# Patient Record
Sex: Male | Born: 1955
Health system: Southern US, Community
[De-identification: ages and names within clinical notes are randomized; demographics above are authoritative.]

## PROBLEM LIST (undated history)

## (undated) DIAGNOSIS — Q231 Congenital insufficiency of aortic valve: Secondary | ICD-10-CM

## (undated) DIAGNOSIS — I471 Supraventricular tachycardia: Secondary | ICD-10-CM

## (undated) DIAGNOSIS — I10 Essential (primary) hypertension: Secondary | ICD-10-CM

## (undated) DIAGNOSIS — G4733 Obstructive sleep apnea (adult) (pediatric): Secondary | ICD-10-CM

## (undated) DIAGNOSIS — I351 Nonrheumatic aortic (valve) insufficiency: Secondary | ICD-10-CM

## (undated) DIAGNOSIS — R002 Palpitations: Secondary | ICD-10-CM

## (undated) DIAGNOSIS — R42 Dizziness and giddiness: Secondary | ICD-10-CM

## (undated) DIAGNOSIS — I493 Ventricular premature depolarization: Secondary | ICD-10-CM

## (undated) DIAGNOSIS — R0602 Shortness of breath: Secondary | ICD-10-CM

## (undated) DIAGNOSIS — R079 Chest pain, unspecified: Secondary | ICD-10-CM

## (undated) DIAGNOSIS — E7800 Pure hypercholesterolemia, unspecified: Secondary | ICD-10-CM

## (undated) DIAGNOSIS — K5090 Crohn's disease, unspecified, without complications: Secondary | ICD-10-CM

## (undated) DIAGNOSIS — I712 Thoracic aortic aneurysm, without rupture: Secondary | ICD-10-CM

## (undated) HISTORY — DX: Nonrheumatic aortic (valve) insufficiency: I35.1

## (undated) HISTORY — DX: Palpitations: R00.2

## (undated) HISTORY — DX: Thoracic aortic aneurysm, without rupture: I71.2

## (undated) HISTORY — DX: Shortness of breath: R06.02

## (undated) HISTORY — DX: Crohn's disease, unspecified, without complications: K50.90

## (undated) HISTORY — DX: Ventricular premature depolarization: I49.3

## (undated) HISTORY — PX: SHOULDER SURGERY: SHX246

## (undated) HISTORY — DX: Dizziness and giddiness: R42

## (undated) HISTORY — DX: Chest pain, unspecified: R07.9

## (undated) HISTORY — DX: Supraventricular tachycardia: I47.1

## (undated) HISTORY — DX: Congenital insufficiency of aortic valve: Q23.1

## (undated) HISTORY — PX: KIDNEY STONE SURGERY: SHX686

## (undated) HISTORY — DX: Obstructive sleep apnea (adult) (pediatric): G47.33

## (undated) HISTORY — PX: PTERYGIUM EXCISION: SHX2273

---
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
## 1998-06-19 ENCOUNTER — Ambulatory Visit (HOSPITAL_COMMUNITY): Admission: RE | Admit: 1998-06-19 | Discharge: 1998-06-19 | Payer: Self-pay | Admitting: Neurology

## 2004-10-27 ENCOUNTER — Ambulatory Visit: Payer: Self-pay | Admitting: Pulmonary Disease

## 2004-11-18 ENCOUNTER — Ambulatory Visit: Payer: Self-pay | Admitting: Pulmonary Disease

## 2004-12-14 ENCOUNTER — Ambulatory Visit: Payer: Self-pay | Admitting: Internal Medicine

## 2005-01-13 ENCOUNTER — Ambulatory Visit: Payer: Self-pay | Admitting: Pulmonary Disease

## 2005-02-09 ENCOUNTER — Ambulatory Visit: Payer: Self-pay | Admitting: Pulmonary Disease

## 2005-02-11 ENCOUNTER — Ambulatory Visit: Payer: Self-pay | Admitting: Pulmonary Disease

## 2005-03-10 ENCOUNTER — Ambulatory Visit: Payer: Self-pay | Admitting: Pulmonary Disease

## 2005-04-21 ENCOUNTER — Ambulatory Visit: Payer: Self-pay | Admitting: Pulmonary Disease

## 2005-06-02 ENCOUNTER — Ambulatory Visit: Payer: Self-pay | Admitting: Pulmonary Disease

## 2005-07-14 ENCOUNTER — Ambulatory Visit: Payer: Self-pay | Admitting: Pulmonary Disease

## 2007-05-23 ENCOUNTER — Encounter: Admission: RE | Admit: 2007-05-23 | Discharge: 2007-05-23 | Payer: Self-pay | Admitting: Gastroenterology

## 2011-09-05 ENCOUNTER — Emergency Department (HOSPITAL_COMMUNITY): Payer: 59

## 2011-09-05 ENCOUNTER — Emergency Department (HOSPITAL_COMMUNITY)
Admission: EM | Admit: 2011-09-05 | Discharge: 2011-09-05 | Disposition: A | Discharge status: Home / Self Care | Payer: 59 | Attending: Emergency Medicine | Admitting: Emergency Medicine

## 2011-09-05 DIAGNOSIS — R350 Frequency of micturition: Secondary | ICD-10-CM | POA: Insufficient documentation

## 2011-09-05 DIAGNOSIS — R109 Unspecified abdominal pain: Secondary | ICD-10-CM | POA: Insufficient documentation

## 2011-09-05 DIAGNOSIS — K5900 Constipation, unspecified: Secondary | ICD-10-CM | POA: Insufficient documentation

## 2011-09-05 DIAGNOSIS — K5090 Crohn's disease, unspecified, without complications: Secondary | ICD-10-CM | POA: Insufficient documentation

## 2011-09-05 DIAGNOSIS — K6289 Other specified diseases of anus and rectum: Secondary | ICD-10-CM | POA: Insufficient documentation

## 2011-09-05 LAB — CBC
HCT: 41.1 % (ref 39.0–52.0)
Hemoglobin: 14.9 g/dL (ref 13.0–17.0)
RBC: 4.75 MIL/uL (ref 4.22–5.81)
RDW: 13 % (ref 11.5–15.5)
WBC: 13.4 10*3/uL — ABNORMAL HIGH (ref 4.0–10.5)

## 2011-09-05 LAB — URINALYSIS, ROUTINE W REFLEX MICROSCOPIC
Bilirubin Urine: NEGATIVE
Leukocytes, UA: NEGATIVE
Nitrite: NEGATIVE
Specific Gravity, Urine: 1.021 (ref 1.005–1.030)
pH: 6 (ref 5.0–8.0)

## 2011-09-05 LAB — BASIC METABOLIC PANEL
BUN: 17 mg/dL (ref 6–23)
CO2: 26 mEq/L (ref 19–32)
Chloride: 102 mEq/L (ref 96–112)
GFR calc Af Amer: >90 mL/min (ref >90)
Potassium: 3.9 mEq/L (ref 3.5–5.1)

## 2011-09-05 LAB — DIFFERENTIAL
Basophils Absolute: 0 10*3/uL (ref 0.0–0.1)
Eosinophils Relative: 0 % (ref 0–5)
Lymphocytes Relative: 8 % — ABNORMAL LOW (ref 12–46)
Neutro Abs: 11.4 10*3/uL — ABNORMAL HIGH (ref 1.7–7.7)
Neutrophils Relative %: 85 % — ABNORMAL HIGH (ref 43–77)

## 2011-09-05 LAB — OCCULT BLOOD, POC DEVICE: Fecal Occult Bld: NEGATIVE

## 2011-09-05 MED ORDER — IOHEXOL 300 MG/ML  SOLN
80.0000 mL | Freq: Once | INTRAMUSCULAR | Status: AC | PRN
  Administered 2011-09-05: 80 mL via INTRAVENOUS

## 2016-09-08 ENCOUNTER — Telehealth: Payer: Self-pay | Admitting: Cardiovascular Disease

## 2016-09-08 NOTE — Telephone Encounter (Signed)
Received records from [facility] for appointment on 09/21/16 with Dr Oval Linsey.  Records given to [facility] (medical records) for Dr Blenda Mounts schedule on 09/21/16. lp

## 2016-09-14 ENCOUNTER — Encounter (INDEPENDENT_AMBULATORY_CARE_PROVIDER_SITE_OTHER): Payer: BLUE CROSS/BLUE SHIELD

## 2016-09-14 ENCOUNTER — Other Ambulatory Visit: Payer: Self-pay | Admitting: Nurse Practitioner

## 2016-09-14 DIAGNOSIS — R079 Chest pain, unspecified: Secondary | ICD-10-CM

## 2016-09-14 DIAGNOSIS — R42 Dizziness and giddiness: Secondary | ICD-10-CM

## 2016-09-20 NOTE — Progress Notes (Signed)
Cardiology Office Note   Date:  09/21/2016   ID:  Ryan Burton, DOB [birth year], MRN [id number]  PCP:  Irven Shelling, MD  Cardiologist:   Skeet Latch, MD   Chief Complaint  Patient presents with  . New Patient (Initial Visit)  . Chest Pain    dull ache, tightness  . Dizziness    randomly.  . Dysmenorrhea    in feet and legs      History of Present Illness: Ryan Burton is a 60 y.o. male with Crohn's disease who presents for an evaluation of chest pain and dizziness. Ryan Burton saw [name], NP on 09/07/16 and reported episods of dizzness and chest pain.  For the last six months he reports episodes of feeling disoriented and dizzy.  It typically occurs when he is is a seated position such as driving or when in a board room.  It is worse when he turns his head drastically.  He hasn't noted it when shaving.  The episodes last for approximately 5 seconds.  He also sometimes notes chest tightness that occurs after the dizzy episodes and sometimes is unrelated.  There is no associated shortness of breath, nausea or diaphoresis.  The episodes don't occur with exertion.  Ryan Burton exercises for 45 minutes most days of the week.  He walks on the treadmill or uses the elliptical and light weights.    He has no chest pain or shortness of breath with this activity, though he notes that he does not exercise very vigorously. He reports having a healthy diet and limits his red meat intake.  Ryan Burton notes that he has been stressed lately.  He has been working up to 70 hours per week.  He notes palpitations that last for less than one minute that are not necessarily associated with shortness of breath or stressful situations. He worries that he  Might have atrial fibrillation, as his mother has this diagnosis. She developed atrial fibrillation in her 50s.  He drinks 2-3 cups of coffee daily and does not use over-the-counter cold or cough medications.   Past  Medical History:  Diagnosis Date  . Chest pain   . Crohn's disease ([place])   . Dizziness   . Palpitations 09/21/2016    Past Surgical History:  Procedure Laterality Date  . KIDNEY STONE SURGERY    . PTERYGIUM EXCISION    . SHOULDER SURGERY       Current Outpatient Prescriptions  Medication Sig Dispense Refill  . aspirin 81 MG tablet Take 81 mg by mouth.    . cyanocobalamin 1000 MCG tablet Take 1,000 mcg by mouth daily.    . Multiple Vitamins-Minerals (MULTIVITAMIN WITH MINERALS) tablet Take 1 tablet by mouth daily.     No current facility-administered medications for this visit.     Allergies:   Ivp dye [iodinated diagnostic agents]    Social History:  The patient  reports that he has never smoked. He does not have any smokeless tobacco history on file. He reports that he drinks alcohol.   Family History:  The patient's family history includes Atrial fibrillation in his mother; Dementia in his father; Stroke in his paternal grandfather.    ROS:  Please see the history of present illness.   Otherwise, review of systems are positive for none.   All other systems are reviewed and negative.    PHYSICAL EXAM: VS:  BP 140/89   Pulse 63   Ht 5\' 10"  (1.778 m)  Wt 78.4 kg (172 lb 12.8 oz)   BMI 24.79 kg/m  , BMI Body mass index is 24.79 kg/m. GENERAL:  Well appearing HEENT:  Pupils equal round and reactive, fundi not visualized, oral mucosa unremarkable NECK:  No jugular venous distention, waveform within normal limits, carotid upstroke brisk and symmetric, no bruits, no thyromegaly LYMPHATICS:  No cervical adenopathy LUNGS:  Clear to auscultation bilaterally HEART:  RRR.  PMI not displaced or sustained,S1 and S2 within normal limits, no S3, no S4, no clicks, no rubs, II/VI sysolic murmur at the LUSB ABD:  Flat, positive bowel sounds normal in frequency in pitch, no bruits, no rebound, no guarding, no midline pulsatile mass, no hepatomegaly, no splenomegaly EXT:  2 plus  pulses throughout, no edema, no cyanosis no clubbing SKIN:  No rashes no nodules NEURO:  Cranial nerves II through XII grossly intact, motor grossly intact throughout PSYCH:  Cognitively intact, oriented to person place and time    EKG:  EKG is not ordered today. The ekg ordered today demonstrates sinus rhythm rate 70 bpm. One PVC.   Recent Labs: No results found for requested labs within last 8760 hours.   09/07/16: Sodium 139, potassium 4.4, BUN 16, creatinine 0.86 WBC 5.9, hemoglobin 15.9, hematocrit 45.4, platelets 209 Troponin less than 0.01  Lipid Panel No results found for: CHOL, TRIG, HDL, CHOLHDL, VLDL, LDLCALC, LDLDIRECT    Wt Readings from Last 3 Encounters:  09/21/16 78.4 kg (172 lb 12.8 oz)      ASSESSMENT AND PLAN:  # Palpitations:  Ryan Burton has short episodes of palpitations. He did have one PVC on his EKG and the suspect that this is what he is feeling. Given the fact that last for less than a minute a likely is not atrial fibrillation. He has worn a monitor and we're currently waiting for this to be uploaded. All his laboratory testing has been unremarkable thus far. We will add a TSH and free T4 is is has not yet been tested.  # Dizziness: Symptoms may be related to carotid hypersensitivity, as it was worse when he turns his head.  He does not have a carotid bruit  It did not become presyncopal with carotid massage. He has no exertional symptoms and it does not occur with positional changes which makes it less likely that this is cardiac in nature. Consider BPPV as well.  # Murmur: Ryan Burton has a faint systolic murmur on exam.  We will get an echo to assess. He has no evidence of heart failure on exam.  # Chest pain: Ms. Mcclaughry has atypical chest pain.  We will get an ETT to assess.   Current medicines are reviewed at length with the patient today.  The patient does not have concerns regarding medicines.  The following changes have been made:   no change  Labs/ tests ordered today include:   Orders Placed This Encounter  Procedures  . T4, free  . TSH  . Exercise Tolerance Test  . ECHOCARDIOGRAM COMPLETE     Disposition:   FU with Alegra Rost C. Oval Linsey, MD, [facility] in 1 month     This note was written with the assistance of speech recognition software.  Please excuse any transcriptional errors.  Signed, Gayl Ivanoff C. Oval Linsey, MD, [facility]  09/21/2016 1:37 PM    [facility]

## 2016-09-21 ENCOUNTER — Ambulatory Visit (INDEPENDENT_AMBULATORY_CARE_PROVIDER_SITE_OTHER): Payer: BLUE CROSS/BLUE SHIELD | Admitting: Cardiovascular Disease

## 2016-09-21 ENCOUNTER — Encounter: Payer: Self-pay | Admitting: Cardiovascular Disease

## 2016-09-21 VITALS — BP 140/89 | HR 63 | Ht 70.0 in | Wt 172.8 lb

## 2016-09-21 DIAGNOSIS — R079 Chest pain, unspecified: Secondary | ICD-10-CM

## 2016-09-21 DIAGNOSIS — R42 Dizziness and giddiness: Secondary | ICD-10-CM | POA: Insufficient documentation

## 2016-09-21 DIAGNOSIS — R011 Cardiac murmur, unspecified: Secondary | ICD-10-CM | POA: Diagnosis not present

## 2016-09-21 DIAGNOSIS — R002 Palpitations: Secondary | ICD-10-CM | POA: Diagnosis not present

## 2016-09-21 HISTORY — DX: Palpitations: R00.2

## 2016-09-21 LAB — TSH: TSH: 1.79 m[IU]/L (ref 0.40–4.50)

## 2016-09-21 LAB — T4, FREE: Free T4: 1.2 ng/dL (ref 0.8–1.8)

## 2016-09-21 NOTE — Patient Instructions (Addendum)
Medication Instructions:  Your physician recommends that you continue on your current medications as directed. Please refer to the Current Medication list given to you today.  Labwork: FT4/TSH AT [facility] ON THE FIRST FLOOR  Testing/Procedures: Your physician has requested that you have an echocardiogram. Echocardiography is a painless test that uses sound waves to create images of your heart. It provides your doctor with information about the size and shape of your heart and how well your heart's chambers and valves are working. This procedure takes approximately one hour. There are no restrictions for this procedure. [place] has requested that you have an exercise tolerance test. For further information please visit [email or web address]. Please also follow instruction sheet, as given.  Follow-Up: Your physician recommends that you schedule a follow-up appointment in: [place]  If you need a refill on your cardiac medications before your next appointment, please call your pharmacy.  Echocardiogram An echocardiogram, or echocardiography, uses sound waves (ultrasound) to produce an image of your heart. The echocardiogram is simple, painless, obtained within a short period of time, and offers valuable information to your health care provider. The images from an echocardiogram can provide information such as:  Evidence of coronary artery disease (CAD).  Heart size.  Heart muscle function.  Heart valve function.  Aneurysm detection.  Evidence of a past heart attack.  Fluid buildup around the heart.  Heart muscle thickening.  Assess heart valve function. Tell a health care provider about:  Any allergies you have.  All medicines you are taking, including vitamins, herbs, eye drops, creams, and over-the-counter medicines.  Any problems you or family members have had with anesthetic medicines.  Any blood disorders you  have.  Any surgeries you have had.  Any medical conditions you have.  Whether you are pregnant or may be pregnant. What happens before the procedure? No special preparation is needed. Eat and drink normally. What happens during the procedure?  In order to produce an image of your heart, gel will be applied to your chest and a wand-like tool (transducer) will be moved over your chest. The gel will help transmit the sound waves from the transducer. The sound waves will harmlessly bounce off your heart to allow the heart images to be captured in real-time motion. These images will then be recorded.  You may need an IV to receive a medicine that improves the quality of the pictures. What happens after the procedure? You may return to your normal schedule including diet, activities, and medicines, unless your health care provider tells you otherwise. This information is not intended to replace advice given to you by your health care provider. Make sure you discuss any questions you have with your health care provider. Document Released: 10/22/2000 Document Revised: 06/12/2016 Document Reviewed: 07/02/2013 Elsevier Interactive Patient Education  2017 [place].   Exercise Stress Electrocardiogram An exercise stress electrocardiogram is a test that is done to evaluate the blood supply to your heart. This test may also be called exercise stress electrocardiography. The test is done while you are walking on a treadmill. The goal of this test is to raise your heart rate. This test is done to find areas of poor blood flow to the heart by determining the extent of coronary artery disease (CAD). CAD is defined as narrowing in one or more heart (coronary) arteries of more than 70%. If you have an abnormal test result, this may mean that you are  not getting adequate blood flow to your heart during exercise. Additional testing may be needed to understand why your test was abnormal. Tell a health care  provider about:  Any allergies you have.  All medicines you are taking, including vitamins, herbs, eye drops, creams, and over-the-counter medicines.  Any problems you or family members have had with anesthetic medicines.  Any blood disorders you have.  Any surgeries you have had.  Any medical conditions you have.  Possibility of pregnancy, if this applies. What are the risks? Generally, this is a safe procedure. However, as with any procedure, complications can occur. Possible complications can include:  Pain or pressure in the following areas:  Chest.  Jaw or neck.  Between your shoulder blades.  Radiating down your left arm.  Dizziness or light-headedness.  Shortness of breath.  Increased or irregular heartbeats.  Nausea or vomiting.  Heart attack (rare). What happens before the procedure?  Avoid all forms of caffeine 24 hours before your test or as directed by your health care provider. This includes coffee, tea (even decaffeinated tea), caffeinated sodas, chocolate, cocoa, and certain pain medicines.  Follow your health care provider's instructions regarding eating and drinking before the test.  Take your medicines as directed at regular times with water unless instructed otherwise. Exceptions may include:  If you have diabetes, ask how you are to take your insulin or pills. It is common to adjust insulin dosing the morning of the test.  If you are taking beta-blocker medicines, it is important to talk to your health care provider about these medicines well before the date of your test. Taking beta-blocker medicines may interfere with the test. In some cases, these medicines need to be changed or stopped 24 hours or more before the test.  If you wear a nitroglycerin patch, it may need to be removed prior to the test. Ask your health care provider if the patch should be removed before the test.  If you use an inhaler for any breathing condition, bring it with  you to the test.  If you are an outpatient, bring a snack so you can eat right after the stress phase of the test.  Do not smoke for 4 hours prior to the test or as directed by your health care provider.  Do not apply lotions, powders, creams, or oils on your chest prior to the test.  Wear loose-fitting clothes and comfortable shoes for the test. This test involves walking on a treadmill. What happens during the procedure?  Multiple patches (electrodes) will be put on your chest. If needed, small areas of your chest may have to be shaved to get better contact with the electrodes. Once the electrodes are attached to your body, multiple wires will be attached to the electrodes and your heart rate will be monitored.  Your heart will be monitored both at rest and while exercising.  You will walk on a treadmill. The treadmill will be started at a slow pace. The treadmill speed and incline will gradually be increased to raise your heart rate. What happens after the procedure?  Your heart rate and blood pressure will be monitored after the test.  You may return to your normal schedule including diet, activities, and medicines, unless your health care provider tells you otherwise. This information is not intended to replace advice given to you by your health care provider. Make sure you discuss any questions you have with your health care provider. Document Released: 10/22/2000 Document Revised: 04/01/2016 Document  Reviewed: 07/02/2013 Elsevier Interactive Patient Education  2017 [employer].

## 2016-10-15 ENCOUNTER — Encounter: Payer: Self-pay | Admitting: Cardiovascular Disease

## 2016-10-21 ENCOUNTER — Other Ambulatory Visit: Payer: Self-pay

## 2016-10-21 ENCOUNTER — Ambulatory Visit (INDEPENDENT_AMBULATORY_CARE_PROVIDER_SITE_OTHER): Payer: BLUE CROSS/BLUE SHIELD

## 2016-10-21 ENCOUNTER — Ambulatory Visit (HOSPITAL_COMMUNITY): Payer: BLUE CROSS/BLUE SHIELD | Attending: Cardiology

## 2016-10-21 DIAGNOSIS — R011 Cardiac murmur, unspecified: Secondary | ICD-10-CM

## 2016-10-21 DIAGNOSIS — R079 Chest pain, unspecified: Secondary | ICD-10-CM | POA: Diagnosis present

## 2016-10-21 LAB — EXERCISE TOLERANCE TEST
CHL CUP MPHR: 159 {beats}/min
CHL CUP RESTING HR STRESS: 70 {beats}/min
CHL RATE OF PERCEIVED EXERTION: 17
CSEPEDS: 0 s
CSEPPHR: 155 {beats}/min
Estimated workload: 11.7 METS
Exercise duration (min): 10 min
Percent HR: 97 %

## 2016-10-25 ENCOUNTER — Ambulatory Visit (INDEPENDENT_AMBULATORY_CARE_PROVIDER_SITE_OTHER): Payer: BLUE CROSS/BLUE SHIELD | Admitting: Cardiovascular Disease

## 2016-10-25 ENCOUNTER — Encounter: Payer: Self-pay | Admitting: Cardiovascular Disease

## 2016-10-25 VITALS — BP 122/80 | HR 74 | Ht 70.0 in | Wt 174.2 lb

## 2016-10-25 DIAGNOSIS — R002 Palpitations: Secondary | ICD-10-CM

## 2016-10-25 DIAGNOSIS — Q231 Congenital insufficiency of aortic valve: Secondary | ICD-10-CM

## 2016-10-25 DIAGNOSIS — Q2381 Bicuspid aortic valve: Secondary | ICD-10-CM

## 2016-10-25 DIAGNOSIS — I351 Nonrheumatic aortic (valve) insufficiency: Secondary | ICD-10-CM | POA: Diagnosis not present

## 2016-10-25 DIAGNOSIS — I7121 Aneurysm of the ascending aorta, without rupture: Secondary | ICD-10-CM

## 2016-10-25 DIAGNOSIS — I712 Thoracic aortic aneurysm, without rupture: Secondary | ICD-10-CM

## 2016-10-25 HISTORY — DX: Thoracic aortic aneurysm, without rupture: I71.2

## 2016-10-25 HISTORY — DX: Aneurysm of the ascending aorta, without rupture: I71.21

## 2016-10-25 HISTORY — DX: Bicuspid aortic valve: Q23.81

## 2016-10-25 HISTORY — DX: Congenital insufficiency of aortic valve: Q23.1

## 2016-10-25 HISTORY — DX: Nonrheumatic aortic (valve) insufficiency: I35.1

## 2016-10-25 NOTE — Progress Notes (Signed)
Cardiology Office Note   Date:  10/25/2016   ID:  Ryan Burton, DOB [birth year], MRN [id number]  PCP:  Irven Shelling, MD  Cardiologist:   Skeet Latch, MD   Chief Complaint  Patient presents with  . Follow-up      History of Present Illness: Ryan Burton is a 60 y.o. male with Crohn's disease who presents for follow-up. Ryan Burton saw [name], NP on 09/07/16 and reported episods of dizzness and chest pain.   He was evaluated in clinic 09/21/16. He was noted to have a murmur on exam. He was referred for an echocardiogram 10/21/16 that revealed LVEF 60-65% with grade 1 diastolic dysfunction. He was also noted to have a functionally bicuspid aortic valve with moderate aortic regurgitation. He also had mild to moderate dilation of the aortic root measuring 4.4 cm. He were 48 hour Holter that showed occasional PVCs but was otherwise unremarkable.  Since his last appointment Ryan Burton has been feeling well.  He denies any recent Chest pain or shortness of breath. He also has not noted any lower extremity edema, orthopnea, PND. He continues to exercise regularly 5 days per week. He has no exertional symptoms.  Past Medical History:  Diagnosis Date  . Aneurysm of ascending aorta ([place]) 10/25/2016   4.4cm by echo 10/21/16  . Bicuspid aortic valve 10/25/2016  . Chest pain   . Crohn's disease ([place])   . Dizziness   . Moderate aortic regurgitation 10/25/2016  . Palpitations 09/21/2016    Past Surgical History:  Procedure Laterality Date  . KIDNEY STONE SURGERY    . PTERYGIUM EXCISION    . SHOULDER SURGERY       Current Outpatient Prescriptions  Medication Sig Dispense Refill  . aspirin 81 MG tablet Take 81 mg by mouth.    . cyanocobalamin 1000 MCG tablet Take 1,000 mcg by mouth daily.    . Multiple Vitamins-Minerals (MULTIVITAMIN WITH MINERALS) tablet Take 1 tablet by mouth daily.     No current facility-administered medications for this visit.       Allergies:   Ivp dye [iodinated diagnostic agents] and Shellfish allergy    Social History:  The patient  reports that he has never smoked. He has never used smokeless tobacco. He reports that he drinks alcohol. He reports that he does not use drugs.   Family History:  The patient's family history includes Atrial fibrillation in his mother; Dementia in his father; Stroke in his paternal grandfather.    ROS:  Please see the history of present illness.   Otherwise, review of systems are positive for none.   All other systems are reviewed and negative.    PHYSICAL EXAM: VS:  BP 122/80 (BP Location: Right Arm, Patient Position: Sitting, Cuff Size: Normal)   Pulse 74   Ht 5\' 10"  (1.778 m)   Wt 79 kg (174 lb 3.2 oz)   SpO2 98%   BMI 25.00 kg/m  , BMI Body mass index is 25 kg/m. GENERAL:  Well appearing HEENT:  Pupils equal round and reactive, fundi not visualized, oral mucosa unremarkable NECK:  No jugular venous distention, waveform within normal limits, carotid upstroke brisk and symmetric, no bruits, no thyromegaly LYMPHATICS:  No cervical adenopathy LUNGS:  Clear to auscultation bilaterally HEART:  RRR.  PMI not displaced or sustained,S1 and S2 within normal limits, no S3, no S4, no clicks, no rubs, II/VI sysolic murmur at the LUSB ABD:  Flat, positive bowel sounds normal in frequency in  pitch, no bruits, no rebound, no guarding, no midline pulsatile mass, no hepatomegaly, no splenomegaly EXT:  2 plus pulses throughout, no edema, no cyanosis no clubbing SKIN:  No rashes no nodules NEURO:  Cranial nerves II through XII grossly intact, motor grossly intact throughout PSYCH:  Cognitively intact, oriented to person place and time   EKG:  EKG is not ordered today. The ekg ordered today demonstrates sinus rhythm rate 70 bpm. One PVC.   Recent Labs: 09/21/2016: TSH 1.79   09/07/16: Sodium 139, potassium 4.4, BUN 16, creatinine 0.86 WBC 5.9, hemoglobin 15.9, hematocrit 45.4,  platelets 209 Troponin less than 0.01  48 hour Holter 09/14/16:  Quality: Fair.  Baseline artifact. Predominant rhythm: sinus rhythm, sinus bradycardia Average heart rate: 70 bpm Max heart rate: 127 bpm Min heart rate: 40 bpm One pause 1.9 sec <1% PVCs Short runs of atrial tachycardia  ETT 10/21/16:  Blood pressure demonstrated a normal response to exercise.  There was no ST segment deviation noted during stress.  Clinically and electrically negative for ischemia  Exclellent exercise capacity  Echo 10/21/16: Study Conclusions  - Left ventricle: The cavity size was normal. There was mild focal   basal hypertrophy of the septum. Systolic function was normal.   The estimated ejection fraction was in the range of 60% to 65%.   Wall motion was normal; there were no regional wall motion   abnormalities. Doppler parameters are consistent with abnormal   left ventricular relaxation (grade 1 diastolic dysfunction). - Aortic valve: Trileaflet; but functionally bicuspid aortic valve   with co-joined left and right leaflets. There was moderate   regurgitation. - Aortic root: The aortic root was dilated measuring 44 mm. - Mitral valve: Structurally normal valve. There was mild   regurgitation. - Left atrium: The atrium was at the upper limits of normal in   size. - Right ventricle: Systolic function was normal. - Right atrium: The atrium was normal in size. - Tricuspid valve: There was trivial regurgitation. - Pulmonary arteries: Systolic pressure was within the normal   range. - Main pulmonary artery: Diameter: 25 mm (ED). - Inferior vena cava: The vessel was normal in size. - Pericardium, extracardiac: There was no pericardial effusion.  Impressions:  - The aortic root was dilated measuring 44 mm with no aortic   stenosis and moderate aortic insufficiency.   Aortic root and ascending aorta are mildly dilated.  Lipid Panel No results found for: CHOL, TRIG, HDL,  CHOLHDL, VLDL, LDLCALC, LDLDIRECT    Wt Readings from Last 3 Encounters:  10/25/16 79 kg (174 lb 3.2 oz)  09/21/16 78.4 kg (172 lb 12.8 oz)      ASSESSMENT AND PLAN:  # Aortic regurgitation: # Bicuspid aortic valve: # Ascending aortic aneurysm: Ryan Burton has a bicuspid aortic valve And moderate aortic regurgitation. We will repeat his echo in 1 month. We discussed warning signs for heart failure including lower extremity edema and shortness of breath. He expressed understanding.  # PVCs:  Laboratory testing has been normal.  48 hour Holter showed one pause <2 seconds, several PVCs (<1%) and short runs of atrial tachycardia.  Symptoms have resolved.   # Dizziness: Resolved.   # Chest pain: Resolved.  ETT negative for ischemia.   Time spent: 25 minutes-Greater than 50% of this time was spent in counseling, explanation of diagnosis, planning of further management, and coordination of care.   Current medicines are reviewed at length with the patient today.  The patient does not have  concerns regarding medicines.  The following changes have been made:  no change  Labs/ tests ordered today include:   Orders Placed This Encounter  Procedures  . ECHOCARDIOGRAM COMPLETE     Disposition:   FU with Kellan Boehlke C. Oval Linsey, MD, [facility] in 1 month     This note was written with the assistance of speech recognition software.  Please excuse any transcriptional errors.  Signed, Marshall Kampf C. Oval Linsey, MD, [facility]  10/25/2016 8:38 AM    [facility]

## 2016-10-25 NOTE — Patient Instructions (Signed)
Medication Instructions:  Your physician recommends that you continue on your current medications as directed. Please refer to the Current Medication list given to you today.  Labwork: none  Testing/Procedures: Your physician has requested that you have an echocardiogram. Echocardiography is a painless test that uses sound waves to create images of your heart. It provides your doctor with information about the size and shape of your heart and how well your heart's chambers and valves are working. This procedure takes approximately one hour. There are no restrictions for this procedure IN 1 YEAR, OFFICE VISIT FEW DAYS AFTER ECHO   Follow-Up: Your physician wants you to follow-up in: 1 year ov after Echo  You will receive a reminder letter in the mail two months in advance. If you don't receive a letter, please call our office to schedule the follow-up appointment.  If you need a refill on your cardiac medications before your next appointment, please call your pharmacy.

## 2016-10-27 ENCOUNTER — Ambulatory Visit: Payer: BLUE CROSS/BLUE SHIELD | Admitting: Cardiovascular Disease

## 2016-11-24 ENCOUNTER — Ambulatory Visit (HOSPITAL_COMMUNITY)
Admission: EM | Admit: 2016-11-24 | Discharge: 2016-11-24 | Disposition: A | Discharge status: Home / Self Care | Payer: BLUE CROSS/BLUE SHIELD | Attending: Family Medicine | Admitting: Family Medicine

## 2016-11-24 ENCOUNTER — Encounter (HOSPITAL_COMMUNITY): Payer: Self-pay | Admitting: *Deleted

## 2016-11-24 DIAGNOSIS — J069 Acute upper respiratory infection, unspecified: Secondary | ICD-10-CM

## 2016-11-24 DIAGNOSIS — B9789 Other viral agents as the cause of diseases classified elsewhere: Secondary | ICD-10-CM | POA: Diagnosis not present

## 2016-11-24 MED ORDER — GUAIFENESIN-CODEINE 100-10 MG/5ML PO SYRP: 10.0000 mL | ORAL_SOLUTION | Freq: Four times a day (QID) | ORAL | 0 refills | Status: DC | PRN

## 2016-11-24 MED ORDER — IPRATROPIUM BROMIDE 0.06 % NA SOLN: 2.0000 | Freq: Four times a day (QID) | NASAL | 1 refills | Status: DC

## 2016-11-24 NOTE — ED Triage Notes (Signed)
Pt  Reports     Cough       And      Scratchy  t  Throat          X 1.5  Days         Recent  Air  Travels         Appears  In no  severe  Distress

## 2016-11-24 NOTE — ED Provider Notes (Signed)
[place]    CSN: [id number] Arrival date & time: 11/24/16  1038     History   Chief Complaint Chief Complaint  Patient presents with  . Cough    HPI Ryan Burton is a 61 y.o. male.   The history is provided by the patient.  Cough  Cough characteristics:  Non-productive Severity:  Moderate Onset quality:  Gradual Duration:  2 days Chronicity:  New Smoker: no   Context: sick contacts, upper respiratory infection and weather changes   Context comment:  Back from travel and sx developed Relieved by:  None tried Worsened by:  Nothing Ineffective treatments:  None tried Associated symptoms: rhinorrhea and sinus congestion   Associated symptoms: no fever and no shortness of breath     Past Medical History:  Diagnosis Date  . Aneurysm of ascending aorta ([place]) 10/25/2016   4.4cm by echo 10/21/16  . Bicuspid aortic valve 10/25/2016  . Chest pain   . Crohn's disease ([place])   . Dizziness   . Moderate aortic regurgitation 10/25/2016  . Palpitations 09/21/2016    Patient Active Problem List   Diagnosis Date Noted  . Moderate aortic regurgitation 10/25/2016  . Bicuspid aortic valve 10/25/2016  . Aneurysm of ascending aorta ([facility]) 10/25/2016  . Dizziness 09/21/2016  . Palpitations 09/21/2016    Past Surgical History:  Procedure Laterality Date  . KIDNEY STONE SURGERY    . PTERYGIUM EXCISION    . SHOULDER SURGERY         Home Medications    Prior to Admission medications   Medication Sig Start Date End Date Taking? Authorizing Provider  aspirin 81 MG tablet Take 81 mg by mouth.    Historical Provider, MD  cyanocobalamin 1000 MCG tablet Take 1,000 mcg by mouth daily.    Historical Provider, MD  Multiple Vitamins-Minerals (MULTIVITAMIN WITH MINERALS) tablet Take 1 tablet by mouth daily.    Historical Provider, MD    Family History Family History  Problem Relation Age of Onset  . Atrial fibrillation Mother   . Dementia Father   . Stroke  Paternal Grandfather     Social History Social History  Substance Use Topics  . Smoking status: Never Smoker  . Smokeless tobacco: Never Used  . Alcohol use Yes     Comment: 12 DRINKS PER WEEK, COMBINED BEER, WINE, LIQUOR      Allergies   Ivp dye [iodinated diagnostic agents] and Shellfish allergy   Review of Systems Review of Systems  Constitutional: Negative.  Negative for fever.  HENT: Positive for congestion, postnasal drip and rhinorrhea.   Respiratory: Positive for cough. Negative for shortness of breath.   Cardiovascular: Negative.   Gastrointestinal: Negative.   Genitourinary: Negative.   Musculoskeletal: Negative.   All other systems reviewed and are negative.    Physical Exam Triage Vital Signs ED Triage Vitals [11/24/16 1118]  Enc Vitals Group     BP 139/93     Pulse Rate 82     Resp 18     Temp 98.9 F (37.2 C)     Temp Source Oral     SpO2 97 %     Weight      Height      Head Circumference      Peak Flow      Pain Score      Pain Loc      Pain Edu?      Excl. in [place]?    No data found.  Updated Vital Signs BP 139/93 (BP Location: Right Arm)   Pulse 82   Temp 98.9 F (37.2 C) (Oral)   Resp 18   SpO2 97%   Visual Acuity Right Eye Distance:   Left Eye Distance:   Bilateral Distance:    Right Eye Near:   Left Eye Near:    Bilateral Near:     Physical Exam  Constitutional: He appears well-developed and well-nourished. No distress.  HENT:  Right Ear: External ear normal.  Left Ear: External ear normal.  Nose: Nose normal.  Mouth/Throat: Oropharynx is clear and moist.  Eyes: Pupils are equal, round, and reactive to light.  Neck: Normal range of motion.  Cardiovascular: Normal rate, regular rhythm, normal heart sounds and intact distal pulses.   Pulmonary/Chest: Effort normal and breath sounds normal. He has no wheezes. He has no rales.  Lymphadenopathy:    He has no cervical adenopathy.  Neurological: He is alert.  Nursing note  and vitals reviewed.    UC Treatments / Results  Labs (all labs ordered are listed, but only abnormal results are displayed) Labs Reviewed - No data to display  EKG  EKG Interpretation None       Radiology No results found.  Procedures Procedures (including critical care time)  Medications Ordered in UC Medications - No data to display   Initial Impression / Assessment and Plan / UC Course  I have reviewed the triage vital signs and the nursing notes.  Pertinent labs & imaging results that were available during my care of the patient were reviewed by me and considered in my medical decision making (see chart for details).  Clinical Course       Final Clinical Impressions(s) / UC Diagnoses   Final diagnoses:  None    New Prescriptions New Prescriptions   No medications on file     Billy Fischer, MD 12/08/16 2046

## 2017-01-13 ENCOUNTER — Encounter: Payer: Self-pay | Admitting: Cardiovascular Disease

## 2017-01-13 ENCOUNTER — Ambulatory Visit (INDEPENDENT_AMBULATORY_CARE_PROVIDER_SITE_OTHER): Payer: BLUE CROSS/BLUE SHIELD | Admitting: Cardiovascular Disease

## 2017-01-13 VITALS — BP 135/80 | HR 60 | Ht 70.0 in | Wt 173.0 lb

## 2017-01-13 DIAGNOSIS — I712 Thoracic aortic aneurysm, without rupture: Secondary | ICD-10-CM | POA: Diagnosis not present

## 2017-01-13 DIAGNOSIS — R0602 Shortness of breath: Secondary | ICD-10-CM

## 2017-01-13 DIAGNOSIS — I7121 Aneurysm of the ascending aorta, without rupture: Secondary | ICD-10-CM

## 2017-01-13 DIAGNOSIS — I351 Nonrheumatic aortic (valve) insufficiency: Secondary | ICD-10-CM

## 2017-01-13 DIAGNOSIS — I471 Supraventricular tachycardia: Secondary | ICD-10-CM

## 2017-01-13 DIAGNOSIS — I493 Ventricular premature depolarization: Secondary | ICD-10-CM | POA: Diagnosis not present

## 2017-01-13 DIAGNOSIS — I4719 Other supraventricular tachycardia: Secondary | ICD-10-CM

## 2017-01-13 MED ORDER — METOPROLOL TARTRATE 25 MG PO TABS: 12.5000 mg | ORAL_TABLET | ORAL | 3 refills | Status: DC | PRN

## 2017-01-13 NOTE — Progress Notes (Signed)
Cardiology Office Note   Date:  01/15/2017   ID:  Ryan Burton, DOB [birth year], MRN [id number]  PCP:  Irven Shelling, MD  Cardiologist:   Skeet Latch, MD   Chief Complaint  Patient presents with  . Follow-up  . Chest Pain    chest tightness;  . Palpitations    at night.       History of Present Illness: Ryan Burton is a 61 y.o. male with Crohn's disease who presents for follow-up.  Ryan Burton saw [name], NP on 09/07/16 and reported episods of dizzness and chest pain.   He was evaluated in clinic 09/21/16. He was noted to have a murmur on exam. He was referred for an echocardiogram 10/21/16 that revealed LVEF 60-65% with grade 1 diastolic dysfunction. He was also noted to have a functionally bicuspid aortic valve with moderate aortic regurgitation. He also had mild to moderate dilation of the aortic root measuring 4.4 cm. He were 48 hour Holter that showed occasional PVCs but was otherwise unremarkable.  Since his last appointment Ryan Burton has noted some episodes of chest tightness.  For the last month he also endorses shortness of breath when walking and has noticed that he get short of breath if he tries to walk and talk at the same time.  He has been much busier at work, working 70 hour weeks, and wonders if this is associated.  The chest tightness occurs both at rest and with exertion.  For several days last week it was constant throughout the day.  He hasn't been exercising as regularly given that he has been busy at work.  His symptoms started after he had URI symptoms last month.  However he continues to have chest dicsomfort and shortness of breath after his cold symptoms dissipated.  He has also noted heart pounding when he lays in bed at night.  This happens approximately three times per week and lasts for up to 20 minutes at a time. There is no associated lightheadedness, dizziness or shortness of breath.  He tried limiting caffeine intake  without any changes.  He denies lower extremity edema, orthopnea or PND.   Past Medical History:  Diagnosis Date  . Aneurysm of ascending aorta ([place]) 10/25/2016   4.4cm by echo 10/21/16  . Atrial tachycardia ([place]) 01/15/2017  . Bicuspid aortic valve 10/25/2016  . Chest pain   . Crohn's disease ([place])   . Dizziness   . Moderate aortic regurgitation 10/25/2016  . Palpitations 09/21/2016  . PVC (premature ventricular contraction) 01/15/2017  . Shortness of breath 01/15/2017    Past Surgical History:  Procedure Laterality Date  . KIDNEY STONE SURGERY    . PTERYGIUM EXCISION    . SHOULDER SURGERY       Current Outpatient Prescriptions  Medication Sig Dispense Refill  . aspirin 81 MG tablet Take 81 mg by mouth.    . cyanocobalamin 1000 MCG tablet Take 1,000 mcg by mouth daily.    . Multiple Vitamins-Minerals (MULTIVITAMIN WITH MINERALS) tablet Take 1 tablet by mouth daily.    . metoprolol tartrate (LOPRESSOR) 25 MG tablet Take 0.5 tablets (12.5 mg total) by mouth as needed. Every 6 hours as needed for tachycardia 30 tablet 3   No current facility-administered medications for this visit.     Allergies:   Ivp dye [iodinated diagnostic agents] and Shellfish allergy    Social History:  The patient  reports that he has never smoked. He has never used smokeless tobacco. He  reports that he drinks alcohol. He reports that he does not use drugs.   Family History:  The patient's family history includes Atrial fibrillation in his mother; Dementia in his father; Stroke in his paternal grandfather.    ROS:  Please see the history of present illness.   Otherwise, review of systems are positive for none.   All other systems are reviewed and negative.    PHYSICAL EXAM: VS:  BP 135/80   Pulse 60   Ht 5\' 10"  (1.778 m)   Wt 78.5 kg (173 lb)   BMI 24.82 kg/m  , BMI Body mass index is 24.82 kg/m. GENERAL:  Well appearing HEENT:  Pupils equal round and reactive, fundi not visualized, oral  mucosa unremarkable NECK:  No jugular venous distention, waveform within normal limits, carotid upstroke brisk and symmetric, no bruits, no thyromegaly LYMPHATICS:  No cervical adenopathy LUNGS:  Clear to auscultation bilaterally HEART:  RRR.  PMI not displaced or sustained,S1 and S2 within normal limits, no S3, no S4, no clicks, no rubs, II/VI diastolic murmur at the LUSB ABD:  Flat, positive bowel sounds normal in frequency in pitch, no bruits, no rebound, no guarding, no midline pulsatile mass, no hepatomegaly, no splenomegaly EXT:  2 plus pulses throughout, no edema, no cyanosis no clubbing SKIN:  No rashes no nodules NEURO:  Cranial nerves II through XII grossly intact, motor grossly intact throughout PSYCH:  Cognitively intact, oriented to person place and time   EKG:  EKG is ordered today. 01/13/17: sinus rhythm.  Rate 60 bpm   Recent Labs: 09/21/2016: TSH 1.79   09/07/16: Sodium 139, potassium 4.4, BUN 16, creatinine 0.86 WBC 5.9, hemoglobin 15.9, hematocrit 45.4, platelets 209 Troponin less than 0.01  48 hour Holter 09/14/16:  Quality: Fair.  Baseline artifact. Predominant rhythm: sinus rhythm, sinus bradycardia Average heart rate: 70 bpm Max heart rate: 127 bpm Min heart rate: 40 bpm One pause 1.9 sec <1% PVCs Short runs of atrial tachycardia  ETT 10/21/16:  Blood pressure demonstrated a normal response to exercise.  There was no ST segment deviation noted during stress.  Clinically and electrically negative for ischemia  Exclellent exercise capacity  Echo 10/21/16: Study Conclusions  - Left ventricle: The cavity size was normal. There was mild focal   basal hypertrophy of the septum. Systolic function was normal.   The estimated ejection fraction was in the range of 60% to 65%.   Wall motion was normal; there were no regional wall motion   abnormalities. Doppler parameters are consistent with abnormal   left ventricular relaxation (grade 1 diastolic  dysfunction). - Aortic valve: Trileaflet; but functionally bicuspid aortic valve   with co-joined left and right leaflets. There was moderate   regurgitation. - Aortic root: The aortic root was dilated measuring 44 mm. - Mitral valve: Structurally normal valve. There was mild   regurgitation. - Left atrium: The atrium was at the upper limits of normal in   size. - Right ventricle: Systolic function was normal. - Right atrium: The atrium was normal in size. - Tricuspid valve: There was trivial regurgitation. - Pulmonary arteries: Systolic pressure was within the normal   range. - Main pulmonary artery: Diameter: 25 mm (ED). - Inferior vena cava: The vessel was normal in size. - Pericardium, extracardiac: There was no pericardial effusion.  Impressions:  - The aortic root was dilated measuring 44 mm with no aortic   stenosis and moderate aortic insufficiency.   Aortic root and ascending aorta are mildly  dilated.  Lipid Panel No results found for: CHOL, TRIG, HDL, CHOLHDL, VLDL, LDLCALC, LDLDIRECT    Wt Readings from Last 3 Encounters:  01/13/17 78.5 kg (173 lb)  10/25/16 79 kg (174 lb 3.2 oz)  09/21/16 78.4 kg (172 lb 12.8 oz)      ASSESSMENT AND PLAN:  # Aortic regurgitation: # Bicuspid aortic valve: # Ascending aortic aneurysm: # Shortness of breath: Ryan Burton has a bicuspid aortic valve and moderate aortic regurgitation.  He has new symptoms of shortness of breath.  He does not have evidence of heart failure on exam.  We will repeat his echo now to ensure that there is no change in his aortic aneurysm or aortic valve disease.  Endocarditis prophylaxis is not indicated.   # PVCs:  Laboratory testing has been normal.  48 hour Holter showed one pause <2 seconds, several PVCs (<1%) and short runs of atrial tachycardia. This is the likely cause of his palpitations.  We will start metoprolol tartrate 12.5mg  q6h prn given that his heart rate is slow at baseline.  #  Dizziness: Resolved.   # Chest pain: Seems unlikely to be ischemia.  ETT negative for ischemia 10/2016.     Current medicines are reviewed at length with the patient today.  The patient does not have concerns regarding medicines.  The following changes have been made:  no change  Labs/ tests ordered today include:   Orders Placed This Encounter  Procedures  . EKG 12-Lead  . ECHOCARDIOGRAM COMPLETE     Disposition:   FU with Fred Hammes C. Oval Linsey, MD, [facility] in 1 month     This note was written with the assistance of speech recognition software.  Please excuse any transcriptional errors.  Signed, Ara Grandmaison C. Oval Linsey, MD, [facility]  01/15/2017 3:04 PM    [facility]

## 2017-01-13 NOTE — Patient Instructions (Signed)
Medication Instructions:  START metoprolol tartrate 12.5 mg (1/2 tablet) AS NEEDED every 6 hours for tachycardia or fast heart rate  Labwork: NONE  Testing/Procedures: Your physician has requested that you have an echocardiogram. Echocardiography is a painless test that uses sound waves to create images of your heart. It provides your doctor with information about the size and shape of your heart and how well your heart's chambers and valves are working. This procedure takes approximately one hour. There are no restrictions for this procedure.   Follow-Up: Your physician recommends that you schedule a follow-up appointment in: 1 month with Dr. Oval Linsey.   Any Other Special Instructions Will Be Listed Below (If Applicable).     If you need a refill on your cardiac medications before your next appointment, please call your pharmacy.

## 2017-01-15 ENCOUNTER — Encounter: Payer: Self-pay | Admitting: Cardiovascular Disease

## 2017-01-15 DIAGNOSIS — I471 Supraventricular tachycardia: Secondary | ICD-10-CM

## 2017-01-15 DIAGNOSIS — R0602 Shortness of breath: Secondary | ICD-10-CM

## 2017-01-15 DIAGNOSIS — I493 Ventricular premature depolarization: Secondary | ICD-10-CM

## 2017-01-15 DIAGNOSIS — I4719 Other supraventricular tachycardia: Secondary | ICD-10-CM

## 2017-01-15 HISTORY — DX: Supraventricular tachycardia: I47.1

## 2017-01-15 HISTORY — DX: Shortness of breath: R06.02

## 2017-01-15 HISTORY — DX: Ventricular premature depolarization: I49.3

## 2017-01-15 HISTORY — DX: Other supraventricular tachycardia: I47.19

## 2017-01-31 ENCOUNTER — Other Ambulatory Visit: Payer: Self-pay

## 2017-01-31 ENCOUNTER — Ambulatory Visit (HOSPITAL_COMMUNITY): Payer: BLUE CROSS/BLUE SHIELD | Attending: Cardiovascular Disease

## 2017-01-31 DIAGNOSIS — R0602 Shortness of breath: Secondary | ICD-10-CM | POA: Diagnosis not present

## 2017-01-31 DIAGNOSIS — I351 Nonrheumatic aortic (valve) insufficiency: Secondary | ICD-10-CM | POA: Insufficient documentation

## 2017-02-16 NOTE — Progress Notes (Addendum)
Cardiology Office Note   Date:  02/17/2017   ID:  Ryan Burton, DOB 1956/05/23, MRN [id number]  PCP:  Irven Shelling, MD  Cardiologist:   Skeet Latch, MD   No chief complaint on file.     History of Present Illness: Ryan Burton is a 61 y.o. male with a bicuspid aortic valve, mild ascending aorta aneurysm, moderate aortic regurgitation, and Crohn's disease who presents for follow-up.  Ryan Burton saw Ryan Garibaldi, NP on 09/07/16 and reported episods of dizzness and chest pain.   He was evaluated in clinic 09/21/16. He was noted to have a murmur on exam. He was referred for an echocardiogram 10/21/16 that revealed LVEF 60-65% with grade 1 diastolic dysfunction. He was also noted to have a functionally bicuspid aortic valve with moderate aortic regurgitation. He also had mild to moderate dilation of the aortic root measuring 4.4 cm.  He had a repeat echo 01/2017  That was essentially unchanged.   He wore a 48 hour Holter that showed occasional PVCs but was otherwise unremarkable.  He also had an ETT 10/2016 that was negative for ischemia.  He hasn't been experiencing PVCs lately so he hasn't been taking metoprolol.  He continues to exercise 5 days per week. He has no chest pain or shortness of breath with activity. He also denies lower extremity edema, orthopnea, or PND.   Past Medical History:  Diagnosis Date  . Aneurysm of ascending aorta ([place]) 10/25/2016   4.4cm by echo 10/21/16  . Atrial tachycardia ([place]) 01/15/2017  . Bicuspid aortic valve 10/25/2016  . Chest pain   . Crohn's disease ([place])   . Dizziness   . Moderate aortic regurgitation 10/25/2016  . Palpitations 09/21/2016  . PVC (premature ventricular contraction) 01/15/2017  . Shortness of breath 01/15/2017    Past Surgical History:  Procedure Laterality Date  . KIDNEY STONE SURGERY    . PTERYGIUM EXCISION    . SHOULDER SURGERY       Current Outpatient Prescriptions  Medication Sig Dispense  Refill  . aspirin 81 MG tablet Take 81 mg by mouth.    . cyanocobalamin 1000 MCG tablet Take 1,000 mcg by mouth daily.    . Multiple Vitamins-Minerals (MULTIVITAMIN WITH MINERALS) tablet Take 1 tablet by mouth daily.     No current facility-administered medications for this visit.     Allergies:   Ivp dye [iodinated diagnostic agents] and Shellfish allergy    Social History:  The patient  reports that he has never smoked. He has never used smokeless tobacco. He reports that he drinks alcohol. He reports that he does not use drugs.   Family History:  The patient's family history includes Atrial fibrillation in his mother; Dementia in his father; Stroke in his paternal grandfather.    ROS:  Please see the history of present illness.   Otherwise, review of systems are positive for none.   All other systems are reviewed and negative.    PHYSICAL EXAM: VS:  BP 116/70   Pulse 68   Ht 5\' 10"  (1.778 m)   Wt 78.3 kg (172 lb 9.6 oz)   BMI 24.77 kg/m  , BMI Body mass index is 24.77 kg/m. GENERAL:  Well appearing HEENT:  Pupils equal round and reactive, fundi not visualized, oral mucosa unremarkable NECK:  No jugular venous distention, waveform within normal limits, carotid upstroke brisk and symmetric, no bruits LYMPHATICS:  No cervical adenopathy LUNGS:  Clear to auscultation bilaterally HEART:  RRR.  PMI  not displaced or sustained,S1 and S2 within normal limits, no S3, no S4, no clicks, no rubs, I/VI diastolic murmur at the left upper sternal border ABD:  Flat, positive bowel sounds normal in frequency in pitch, no bruits, no rebound, no guarding, no midline pulsatile mass, no hepatomegaly, no splenomegaly EXT:  2 plus pulses throughout, no edema, no cyanosis no clubbing SKIN:  No rashes no nodules NEURO:  Cranial nerves II through XII grossly intact, motor grossly intact throughout PSYCH:  Cognitively intact, oriented to person place and time   EKG:  EKG is not ordered today. 01/13/17:  sinus rhythm.  Rate 60 bpm   Recent Labs: 09/21/2016: TSH 1.79   09/07/16: Sodium 139, potassium 4.4, BUN 16, creatinine 0.86 WBC 5.9, hemoglobin 15.9, hematocrit 45.4, platelets 209 Troponin less than 0.01  03/19/2015: Cholesterol 185, triglycerides 96, HDL 42, LDL 123  48 hour Holter 09/14/16:  Quality: Fair.  Baseline artifact. Predominant rhythm: sinus rhythm, sinus bradycardia Average heart rate: 70 bpm Max heart rate: 127 bpm Min heart rate: 40 bpm One pause 1.9 sec <1% PVCs Short runs of atrial tachycardia  ETT 10/21/16:  Blood pressure demonstrated a normal response to exercise.  There was no ST segment deviation noted during stress.  Clinically and electrically negative for ischemia  Exclellent exercise capacity  Echo 10/21/16: Study Conclusions  - Left ventricle: The cavity size was normal. There was mild focal   basal hypertrophy of the septum. Systolic function was normal.   The estimated ejection fraction was in the range of 60% to 65%.   Wall motion was normal; there were no regional wall motion   abnormalities. Doppler parameters are consistent with abnormal   left ventricular relaxation (grade 1 diastolic dysfunction). - Aortic valve: Trileaflet; but functionally bicuspid aortic valve   with co-joined left and right leaflets. There was moderate   regurgitation. - Aortic root: The aortic root was dilated measuring 44 mm. - Mitral valve: Structurally normal valve. There was mild   regurgitation. - Left atrium: The atrium was at the upper limits of normal in   size. - Right ventricle: Systolic function was normal. - Right atrium: The atrium was normal in size. - Tricuspid valve: There was trivial regurgitation. - Pulmonary arteries: Systolic pressure was within the normal   range. - Main pulmonary artery: Diameter: 25 mm (ED). - Inferior vena cava: The vessel was normal in size. - Pericardium, extracardiac: There was no pericardial  effusion.  Impressions:  - The aortic root was dilated measuring 44 mm with no aortic   stenosis and moderate aortic insufficiency.   Aortic root and ascending aorta are mildly dilated.  Echo 01/31/17: Study Conclusions  - Left ventricle: The cavity size was normal. Wall thickness was   normal. Systolic function was normal. The estimated ejection   fraction was in the range of 55% to 60%. Wall motion was normal;   there were no regional wall motion abnormalities. Doppler   parameters are consistent with abnormal left ventricular   relaxation (grade 1 diastolic dysfunction). - Aortic valve: Possible bicuspid valve with apparent fusion of the   right and left coronary cusps. There was no stenosis. There was   mild to moderate regurgitation. - Aorta: Mildly dilated aortic root. Aortic root dimension: 43 mm   (ED). Ascending aortic diameter: 39 mm (S). - Mitral valve: There was trivial regurgitation. - Right ventricle: The cavity size was normal. Systolic function   was normal. - Tricuspid valve: Peak RV-RA gradient (S):  24 mm Hg. - Pulmonary arteries: PA peak pressure: 27 mm Hg (S). - Inferior vena cava: The vessel was normal in size. The   respirophasic diameter changes were in the normal range (>= 50%),   consistent with normal central venous pressure.   Lipid Panel No results found for: CHOL, TRIG, HDL, CHOLHDL, VLDL, LDLCALC, LDLDIRECT    Wt Readings from Last 3 Encounters:  02/17/17 78.3 kg (172 lb 9.6 oz)  01/13/17 78.5 kg (173 lb)  10/25/16 79 kg (174 lb 3.2 oz)      ASSESSMENT AND PLAN:  # Aortic regurgitation: # Bicuspid aortic valve: # Ascending aortic aneurysm: Mr. Jolley has a bicuspid aortic valve and mild-moderate aortic regurgitation.  His echocardiogram was stable. We will repeat in 1 year. Shortness of breath has resolved.   # PVCs:  Laboratory testing has been normal.  48 hour Holter showed one pause <2 seconds, several PVCs (<1%) and short runs  of atrial tachycardia. He hasn't manic using many palpitations lately but has metoprolol take as needed.  # Chest pain:  ETT negative for ischemia 10/2016.     Current medicines are reviewed at length with the patient today.  The patient does not have concerns regarding medicines.  The following changes have been made:  no change  Labs/ tests ordered today include:   Orders Placed This Encounter  Procedures  . ECHOCARDIOGRAM COMPLETE     Disposition:   FU with Reathel Turi C. Oval Linsey, MD, [facility] in 1 year.   This note was written with the assistance of speech recognition software.  Please excuse any transcriptional errors.  Signed, Azell Bill C. Oval Linsey, MD, [facility]  02/17/2017 8:37 AM    [facility]

## 2017-02-17 ENCOUNTER — Ambulatory Visit (INDEPENDENT_AMBULATORY_CARE_PROVIDER_SITE_OTHER): Payer: BLUE CROSS/BLUE SHIELD | Admitting: Cardiovascular Disease

## 2017-02-17 ENCOUNTER — Encounter: Payer: Self-pay | Admitting: Cardiovascular Disease

## 2017-02-17 VITALS — BP 116/70 | HR 68 | Ht 70.0 in | Wt 172.6 lb

## 2017-02-17 DIAGNOSIS — I712 Thoracic aortic aneurysm, without rupture: Secondary | ICD-10-CM | POA: Diagnosis not present

## 2017-02-17 DIAGNOSIS — I351 Nonrheumatic aortic (valve) insufficiency: Secondary | ICD-10-CM

## 2017-02-17 DIAGNOSIS — I493 Ventricular premature depolarization: Secondary | ICD-10-CM

## 2017-02-17 DIAGNOSIS — Q231 Congenital insufficiency of aortic valve: Secondary | ICD-10-CM

## 2017-02-17 DIAGNOSIS — I7121 Aneurysm of the ascending aorta, without rupture: Secondary | ICD-10-CM

## 2017-02-17 NOTE — Patient Instructions (Addendum)
Medication Instructions:  Your physician recommends that you continue on your current medications as directed. Please refer to the Current Medication list given to you today.  Labwork: Will request lipid panel from your Primary Care   Testing/Procedures: Your physician has requested that you have an echocardiogram. Echocardiography is a painless test that uses sound waves to create images of your heart. It provides your doctor with information about the size and shape of your heart and how well your heart's chambers and valves are working. This procedure takes approximately one hour. There are no restrictions for this procedure. 1 year   Follow-Up: Your physician wants you to follow-up in: 1 year after Echo You will receive a reminder letter in the mail two months in advance. If you don't receive a letter, please call our office to schedule the follow-up appointment.  If you need a refill on your cardiac medications before your next appointment, please call your pharmacy.

## 2018-03-08 ENCOUNTER — Ambulatory Visit (HOSPITAL_COMMUNITY): Payer: Managed Care, Other (non HMO) | Attending: Cardiology

## 2018-03-08 ENCOUNTER — Other Ambulatory Visit: Payer: Self-pay

## 2018-03-08 DIAGNOSIS — I712 Thoracic aortic aneurysm, without rupture: Secondary | ICD-10-CM | POA: Insufficient documentation

## 2018-03-08 DIAGNOSIS — I7121 Aneurysm of the ascending aorta, without rupture: Secondary | ICD-10-CM

## 2018-03-08 DIAGNOSIS — I493 Ventricular premature depolarization: Secondary | ICD-10-CM | POA: Insufficient documentation

## 2018-03-08 DIAGNOSIS — I351 Nonrheumatic aortic (valve) insufficiency: Secondary | ICD-10-CM | POA: Insufficient documentation

## 2018-03-08 DIAGNOSIS — I471 Supraventricular tachycardia: Secondary | ICD-10-CM | POA: Insufficient documentation

## 2018-03-21 ENCOUNTER — Ambulatory Visit: Payer: BLUE CROSS/BLUE SHIELD | Admitting: Cardiovascular Disease

## 2018-03-21 ENCOUNTER — Encounter: Payer: Self-pay | Admitting: Cardiovascular Disease

## 2018-03-21 VITALS — BP 122/72 | HR 57 | Ht 70.0 in | Wt 172.4 lb

## 2018-03-21 DIAGNOSIS — I712 Thoracic aortic aneurysm, without rupture: Secondary | ICD-10-CM

## 2018-03-21 DIAGNOSIS — I351 Nonrheumatic aortic (valve) insufficiency: Secondary | ICD-10-CM

## 2018-03-21 DIAGNOSIS — I7121 Aneurysm of the ascending aorta, without rupture: Secondary | ICD-10-CM

## 2018-03-21 DIAGNOSIS — Q231 Congenital insufficiency of aortic valve: Secondary | ICD-10-CM | POA: Diagnosis not present

## 2018-03-21 NOTE — Patient Instructions (Signed)

## 2018-03-21 NOTE — Progress Notes (Signed)
Cardiology Office Note   Date:  03/21/2018   ID:  Ryan Burton, DOB 05-Oct-1956, MRN [id number]  PCP:  Lavone Orn, MD  Cardiologist:   Skeet Latch, MD   No chief complaint on file.     History of Present Illness: Ryan Burton is a 62 y.o. male with a bicuspid aortic valve, mild ascending aorta aneurysm, moderate aortic regurgitation, and Crohn's disease who presents for follow-up.  Mr.Bench saw Ryan Garibaldi, NP on 09/07/16 and reported episods of dizzness and chest pain.   He was evaluated in clinic 09/21/16. He was noted to have a murmur on exam. He was referred for an echocardiogram 10/21/16 that revealed LVEF 60-65% with grade 1 diastolic dysfunction. He was also noted to have a functionally bicuspid aortic valve with moderate aortic regurgitation. He also had mild to moderate dilation of the aortic root measuring 4.4 cm.  He had a repeat echo 01/2017 and 03/2018 that were essentially unchanged.   He wore a 48 hour Holter that showed occasional PVCs but was otherwise unremarkable.  He also had an ETT 10/2016 that was negative for ischemia.  Mr. [name] has been feeling well.  6 weeks ago he gave up caffeine.  Since then he has no more chest tightness or PVCs.  He continues to go to the fitness center nearly every morning.  He rides the treadmill,, elliptical and stationary bike.  He feels well with exercise.  He has no chest pain or shortness of breath.  He has not noted any lower extremity edema, orthopnea, or PND.  He is due for fasting labs with his PCP.   Past Medical History:  Diagnosis Date  . Aneurysm of ascending aorta ([place]) 10/25/2016   4.4cm by echo 10/21/16  . Atrial tachycardia ([place]) 01/15/2017  . Bicuspid aortic valve 10/25/2016  . Chest pain   . Crohn's disease ([place])   . Dizziness   . Moderate aortic regurgitation 10/25/2016  . Palpitations 09/21/2016  . PVC (premature ventricular contraction) 01/15/2017  . Shortness of breath 01/15/2017     Past Surgical History:  Procedure Laterality Date  . KIDNEY STONE SURGERY    . PTERYGIUM EXCISION    . SHOULDER SURGERY       Current Outpatient Medications  Medication Sig Dispense Refill  . cyanocobalamin 1000 MCG tablet Take 1,000 mcg by mouth daily.    . Multiple Vitamins-Minerals (MULTIVITAMIN WITH MINERALS) tablet Take 1 tablet by mouth daily.     No current facility-administered medications for this visit.     Allergies:   Ivp dye [iodinated diagnostic agents] and Shellfish allergy    Social History:  The patient  reports that he has never smoked. He has never used smokeless tobacco. He reports that he drinks alcohol. He reports that he does not use drugs.   Family History:  The patient's family history includes Atrial fibrillation in his mother; Dementia in his father; Stroke in his paternal grandfather.    ROS:  Please see the history of present illness.   Otherwise, review of systems are positive for none.   All other systems are reviewed and negative.    PHYSICAL EXAM: VS:  BP 122/72   Pulse (!) 57   Ht 5\' 10"  (1.778 m)   Wt 172 lb 6.4 oz (78.2 kg)   BMI 24.74 kg/m  , BMI Body mass index is 24.74 kg/m. GENERAL:  Well appearing.  No acute distress. HEENT:  Pupils equal round and reactive, fundi not visualized,  oral mucosa unremarkable NECK:  No jugular venous distention, waveform within normal limits, carotid upstroke brisk and symmetric, no bruits LYMPHATICS:  No cervical adenopathy LUNGS:  Clear to auscultation bilaterally HEART:  RRR.  PMI not displaced or sustained,S1 and S2 within normal limits, no S3, no S4, no clicks, no rubs, I/VI diastolic murmur at the left upper sternal border ABD:  Flat, positive bowel sounds normal in frequency in pitch, no bruits, no rebound, no guarding, no midline pulsatile mass, no hepatomegaly, no splenomegaly EXT:  2 plus pulses throughout, no edema, no cyanosis no clubbing SKIN:  No rashes no nodules NEURO:  Cranial  nerves II through XII grossly intact, motor grossly intact throughout PSYCH:  Cognitively intact, oriented to person place and time   EKG:  EKG is ordered today. 01/13/17: sinus rhythm.  Rate 60 bpm 03/21/18: Sinus bradycardia.  Rate 57 bpm.   Recent Labs: No results found for requested labs within last 8760 hours.   09/07/16: Sodium 139, potassium 4.4, BUN 16, creatinine 0.86 WBC 5.9, hemoglobin 15.9, hematocrit 45.4, platelets 209 Troponin less than 0.01  03/19/2015: Cholesterol 185, triglycerides 96, HDL 42, LDL 123  48 hour Holter 09/14/16:  Quality: Fair.  Baseline artifact. Predominant rhythm: sinus rhythm, sinus bradycardia Average heart rate: 70 bpm Max heart rate: 127 bpm Min heart rate: 40 bpm One pause 1.9 sec <1% PVCs Short runs of atrial tachycardia  ETT 10/21/16:  Blood pressure demonstrated a normal response to exercise.  There was no ST segment deviation noted during stress.  Clinically and electrically negative for ischemia  Exclellent exercise capacity  Echo 03/2018: Study Conclusions  - Left ventricle: The cavity size was normal. Wall thickness was   normal. Systolic function was normal. The estimated ejection   fraction was in the range of 55% to 60%. Wall motion was normal;   there were no regional wall motion abnormalities. Doppler   parameters are consistent with abnormal left ventricular   relaxation (grade 1 diastolic dysfunction). - Aortic valve: Bicuspid valve with fused left and right coronary   cusps. There was no stenosis. There was mild regurgitation. - Aorta: Mildly dilated aortic root and ascending aorta. Aortic   root dimension: 43 mm (ED). Ascending aortic diameter: 41 mm (S). - Right ventricle: The cavity size was normal. Systolic function   was normal. - Tricuspid valve: Peak RV-RA gradient (S): 25 mm Hg. - Pulmonary arteries: PA peak pressure: 28 mm Hg (S). - Inferior vena cava: The vessel was normal in size. The    respirophasic diameter changes were in the normal range (>= 50%),   consistent with normal central venous pressure.  Impressions:  - Normal LV size with EF 55-60%. Normal RV size and systolic   function. Bicuspid aortic valve (fused left and right coronary   cusps) with mild aortic insufficiency.  Echo 01/31/17: Study Conclusions  - Left ventricle: The cavity size was normal. Wall thickness was   normal. Systolic function was normal. The estimated ejection   fraction was in the range of 55% to 60%. Wall motion was normal;   there were no regional wall motion abnormalities. Doppler   parameters are consistent with abnormal left ventricular   relaxation (grade 1 diastolic dysfunction). - Aortic valve: Possible bicuspid valve with apparent fusion of the   right and left coronary cusps. There was no stenosis. There was   mild to moderate regurgitation. - Aorta: Mildly dilated aortic root. Aortic root dimension: 43 mm   (ED). Ascending aortic  diameter: 39 mm (S). - Mitral valve: There was trivial regurgitation. - Right ventricle: The cavity size was normal. Systolic function   was normal. - Tricuspid valve: Peak RV-RA gradient (S): 24 mm Hg. - Pulmonary arteries: PA peak pressure: 27 mm Hg (S). - Inferior vena cava: The vessel was normal in size. The   respirophasic diameter changes were in the normal range (>= 50%),   consistent with normal central venous pressure.   Lipid Panel No results found for: CHOL, TRIG, HDL, CHOLHDL, VLDL, LDLCALC, LDLDIRECT    Wt Readings from Last 3 Encounters:  03/21/18 172 lb 6.4 oz (78.2 kg)  02/17/17 172 lb 9.6 oz (78.3 kg)  01/13/17 173 lb (78.5 kg)      ASSESSMENT AND PLAN:  # Aortic regurgitation: # Bicuspid aortic valve: # Ascending aortic aneurysm: Mr. Goerner has a bicuspid aortic valve and mild-moderate aortic regurgitation.  His echocardiogram has been stable for the last 2 years.  He has no symptoms.  Repeat echo in 2  years.  # PVCs:  Laboratory testing has been normal.  48 hour Holter showed one pause <2 seconds, several PVCs (<1%) and short runs of atrial tachycardia. He is asymptomatic.   # Chest pain:  Resolved.  ETT negative for ischemia 10/2016.     Current medicines are reviewed at length with the patient today.  The patient does not have concerns regarding medicines.  The following changes have been made:  no change  Labs/ tests ordered today include:   No orders of the defined types were placed in this encounter.    Disposition:   FU with [place] Name C. Oval Linsey, MD, [facility] in 1 year.     Signed, [place] Pizzimenti C. Oval Linsey, MD, [facility]  03/21/2018 8:31 AM    [place]

## 2019-03-07 ENCOUNTER — Telehealth: Payer: Self-pay | Admitting: *Deleted

## 2019-03-07 NOTE — Telephone Encounter (Signed)
03/07/19 LMOM @ [id number], re: follow up appointment.

## 2019-08-08 ENCOUNTER — Encounter

## 2019-09-04 ENCOUNTER — Other Ambulatory Visit: Payer: Self-pay

## 2019-09-04 ENCOUNTER — Encounter: Payer: Self-pay | Admitting: Cardiovascular Disease

## 2019-09-04 ENCOUNTER — Ambulatory Visit: Payer: Managed Care, Other (non HMO) | Admitting: Cardiovascular Disease

## 2019-09-04 VITALS — BP 134/64 | HR 57 | Ht 70.0 in | Wt 175.4 lb

## 2019-09-04 DIAGNOSIS — I10 Essential (primary) hypertension: Secondary | ICD-10-CM | POA: Diagnosis not present

## 2019-09-04 DIAGNOSIS — E7800 Pure hypercholesterolemia, unspecified: Secondary | ICD-10-CM | POA: Diagnosis not present

## 2019-09-04 DIAGNOSIS — Q231 Congenital insufficiency of aortic valve: Secondary | ICD-10-CM

## 2019-09-04 HISTORY — DX: Essential (primary) hypertension: I10

## 2019-09-04 HISTORY — DX: Pure hypercholesterolemia, unspecified: E78.00

## 2019-09-04 NOTE — Patient Instructions (Addendum)
Medication Instructions:  Your physician recommends that you continue on your current medications as directed. Please refer to the Current Medication list given to you today.  *If you need a refill on your cardiac medications before your next appointment, please call your pharmacy*  Lab Work: FASTING LP/CMET TO BE DONE WHEN YOU GO FOR YOUR ECHO   Testing/Procedures: Your physician has requested that you have an echocardiogram. Echocardiography is a painless test that uses sound waves to create images of your heart. It provides your doctor with information about the size and shape of your heart and how well your heart's chambers and valves are working. This procedure takes approximately one hour. There are no restrictions for this procedure. [facility] [place], [address] TO BE DONE IN 3 MONTHS WEEK BEFORE FOLLOW UP WITH DR Bruning   Follow-Up: At [facility] you and your health needs are our priority.  As part of our continuing mission to provide you with exceptional heart care, we have created designated Provider Care Teams.  These Care Teams include your primary Cardiologist (physician) and Advanced Practice Providers (APPs -  Physician Assistants and Nurse Practitioners) who all work together to provide you with the care you need, when you need it.  Your next appointment:   3 months  The format for your next appointment:   Either In Person or Virtual  Provider:   You may see DR Aurelia Osborn [facility]  or one of the following Advanced Practice Providers on your designated Care Team:    Kerin Ransom, PA-C  [place], [place]  Coletta Memos, [place]   Other Instructions WORK ON DIET AND EXERCISE   MONITOR AND LOG YOUR BLOOD PRESSURE. BRING READINGS AND MACHINE TO FOLLOW UP

## 2019-09-04 NOTE — Progress Notes (Signed)
Cardiology Office Note   Date:  09/04/2019   ID:  Ryan Burton, DOB 1956-05-06, MRN [id number]  PCP:  Lavone Orn, MD  Cardiologist:   Skeet Latch, MD   No chief complaint on file.    History of Present Illness: Ryan Burton is a 63 y.o. male with a bicuspid aortic valve, mild ascending aorta aneurysm, moderate aortic regurgitation, and Crohn's disease who presents for follow-up.  Ryan Burton saw Ryan Garibaldi, NP on 09/07/16 and reported episods of dizzness and chest pain.   He was evaluated in clinic 09/21/16. He was noted to have a murmur on exam. He was referred for an echocardiogram 10/21/16 that revealed LVEF 60-65% with grade 1 diastolic dysfunction. He was also noted to have a functionally bicuspid aortic valve with moderate aortic regurgitation. He also had mild to moderate dilation of the aortic root measuring 4.4 cm.  He had a repeat echo 01/2017 and 03/2018 that were essentially unchanged.   He wore a 48 hour Holter that showed occasional PVCs but was otherwise unremarkable.  He also had an ETT 10/2016 that was negative for ischemia.  Lately he has been feeling well.  Occasionally he has a few seconds of dizziness.  It seems to occur randomly and is sometimes associated with a visual disturbance.  It seems like bright lights or diplopia.  He saw his PCP this summer and his BP was borderline.  He bought a blood pressure cuff and has been checking his blood pressure.  At home it has been running in the 120s to 150s over 70s to 80s.  His heart rate has been in the 50s to 80s.  Overall his diet is okay.  He has not been paying any particular attention to it and does like to have ice cream.  He goes to the gym regularly and walks and rides the elliptical for warm up but does not focus on cardio.  He plays golf and walks the course walking on his back.  He has no exertional chest pain or shortness of breath.  He also denies lower extremity edema, orthopnea, or PND.    Past Medical History:  Diagnosis Date  . Aneurysm of ascending aorta ([place]) 10/25/2016   4.4cm by echo 10/21/16  . Atrial tachycardia ([place]) 01/15/2017  . Bicuspid aortic valve 10/25/2016  . Chest pain   . Crohn's disease ([place])   . Dizziness   . Essential hypertension 09/04/2019  . Moderate aortic regurgitation 10/25/2016  . Palpitations 09/21/2016  . Pure hypercholesterolemia 09/04/2019  . PVC (premature ventricular contraction) 01/15/2017  . Shortness of breath 01/15/2017    Past Surgical History:  Procedure Laterality Date  . KIDNEY STONE SURGERY    . PTERYGIUM EXCISION    . SHOULDER SURGERY       Current Outpatient Medications  Medication Sig Dispense Refill  . cyanocobalamin 1000 MCG tablet Take 1,000 mcg by mouth daily.    . Multiple Vitamins-Minerals (MULTIVITAMIN WITH MINERALS) tablet Take 1 tablet by mouth daily.     No current facility-administered medications for this visit.     Allergies:   Ivp dye [iodinated diagnostic agents] and Shellfish allergy    Social History:  The patient  reports that he has never smoked. He has never used smokeless tobacco. He reports current alcohol use. He reports that he does not use drugs.   Family History:  The patient's family history includes Atrial fibrillation in his mother; Dementia in his father; Stroke in his paternal grandfather.  ROS:  Please see the history of present illness.   Otherwise, review of systems are positive for none.   All other systems are reviewed and negative.    PHYSICAL EXAM: VS:  BP 134/64   Pulse (!) 57   Ht 5\' 10"  (1.778 m)   Wt 175 lb 6.4 oz (79.6 kg)   BMI 25.17 kg/m  , BMI Body mass index is 25.17 kg/m. GENERAL:  Well appearing HEENT: Pupils equal round and reactive, fundi not visualized, oral mucosa unremarkable NECK:  No jugular venous distention, waveform within normal limits, carotid upstroke brisk and symmetric, no bruits, no thyromegaly LYMPHATICS:  No cervical adenopathy LUNGS:   Clear to auscultation bilaterally HEART:  RRR.  PMI not displaced or sustained,S1 and S2 within normal limits, no S3, no S4, no clicks, no rubs, no murmurs ABD:  Flat, positive bowel sounds normal in frequency in pitch, no bruits, no rebound, no guarding, no midline pulsatile mass, no hepatomegaly, no splenomegaly EXT:  2 plus pulses throughout, no edema, no cyanosis no clubbing SKIN:  No rashes no nodules NEURO:  Cranial nerves II through XII grossly intact, motor grossly intact throughout PSYCH:  Cognitively intact, oriented to person place and time   EKG:  EKG is ordered today. 01/13/17: sinus rhythm.  Rate 60 bpm 03/21/18: Sinus bradycardia.  Rate 57 bpm. 09/04/19: Sinus bradycardia.  Rate 57 bpm.   Recent Labs: No results found for requested labs within last 8760 hours.   06/14/2019: Total cholesterol 221, triglycerides 112, HDL 44, LDL 155 Hemoglobin A1c 5.4% Potassium 4.4, creatinine 0.8  03/19/2015: Cholesterol 185, triglycerides 96, HDL 42, LDL 123  48 hour Holter 09/14/16:  Quality: Fair.  Baseline artifact. Predominant rhythm: sinus rhythm, sinus bradycardia Average heart rate: 70 bpm Max heart rate: 127 bpm Min heart rate: 40 bpm One pause 1.9 sec <1% PVCs Short runs of atrial tachycardia  ETT 10/21/16:  Blood pressure demonstrated a normal response to exercise.  There was no ST segment deviation noted during stress.  Clinically and electrically negative for ischemia  Exclellent exercise capacity  Echo 03/2018: Study Conclusions  - Left ventricle: The cavity size was normal. Wall thickness was   normal. Systolic function was normal. The estimated ejection   fraction was in the range of 55% to 60%. Wall motion was normal;   there were no regional wall motion abnormalities. Doppler   parameters are consistent with abnormal left ventricular   relaxation (grade 1 diastolic dysfunction). - Aortic valve: Bicuspid valve with fused left and right coronary    cusps. There was no stenosis. There was mild regurgitation. - Aorta: Mildly dilated aortic root and ascending aorta. Aortic   root dimension: 43 mm (ED). Ascending aortic diameter: 41 mm (S). - Right ventricle: The cavity size was normal. Systolic function   was normal. - Tricuspid valve: Peak RV-RA gradient (S): 25 mm Hg. - Pulmonary arteries: PA peak pressure: 28 mm Hg (S). - Inferior vena cava: The vessel was normal in size. The   respirophasic diameter changes were in the normal range (>= 50%),   consistent with normal central venous pressure.  Impressions:  - Normal LV size with EF 55-60%. Normal RV size and systolic   function. Bicuspid aortic valve (fused left and right coronary   cusps) with mild aortic insufficiency.  Echo 01/31/17: Study Conclusions  - Left ventricle: The cavity size was normal. Wall thickness was   normal. Systolic function was normal. The estimated ejection   fraction was  in the range of 55% to 60%. Wall motion was normal;   there were no regional wall motion abnormalities. Doppler   parameters are consistent with abnormal left ventricular   relaxation (grade 1 diastolic dysfunction). - Aortic valve: Possible bicuspid valve with apparent fusion of the   right and left coronary cusps. There was no stenosis. There was   mild to moderate regurgitation. - Aorta: Mildly dilated aortic root. Aortic root dimension: 43 mm   (ED). Ascending aortic diameter: 39 mm (S). - Mitral valve: There was trivial regurgitation. - Right ventricle: The cavity size was normal. Systolic function   was normal. - Tricuspid valve: Peak RV-RA gradient (S): 24 mm Hg. - Pulmonary arteries: PA peak pressure: 27 mm Hg (S). - Inferior vena cava: The vessel was normal in size. The   respirophasic diameter changes were in the normal range (>= 50%),   consistent with normal central venous pressure.   Lipid Panel No results found for: CHOL, TRIG, HDL, CHOLHDL, VLDL, LDLCALC,  LDLDIRECT    Wt Readings from Last 3 Encounters:  09/04/19 175 lb 6.4 oz (79.6 kg)  03/21/18 172 lb 6.4 oz (78.2 kg)  02/17/17 172 lb 9.6 oz (78.3 kg)      ASSESSMENT AND PLAN:  # Aortic regurgitation: # Bicuspid aortic valve: # Ascending aortic aneurysm: Mr. Oberlander has a bicuspid aortic valve and mild-moderate aortic regurgitation. He is euvolemic and asymptomatic.  We will repeat his echocardiogram in 3 to 4 months.  # Essential hypertension: BP has been elevated on multiple occasions.  He would like to work on diet and exercise.  He is going to try to increase his cardio, limit salt intake and reduce sweets.  He will continue to track his blood pressure at home and bring to follow-up.  # Pure hypercholesterolemia.  LDL was 155 on 06/2019.  ASCVD 10-year risk is 14.8%.  He wants to work on diet and exercise as above before starting any medication.  We will repeat labs in 3 months.  # PVCs:  Laboratory testing has been normal.  48 hour Holter showed one pause <2 seconds, several PVCs (<1%) and short runs of atrial tachycardia. He is asymptomatic.   # Chest pain:  Resolved.  ETT negative for ischemia 10/2016.     Current medicines are reviewed at length with the patient today.  The patient does not have concerns regarding medicines.  The following changes have been made:  no change  Labs/ tests ordered today include:   Orders Placed This Encounter  Procedures  . Lipid panel  . Comprehensive metabolic panel  . EKG 12-Lead  . ECHOCARDIOGRAM COMPLETE     Disposition:   FU with Tasharra Nodine C. Oval Linsey, MD, [facility] in 3-4 months     Signed, Brysen Shankman C. Oval Linsey, MD, [facility]  09/04/2019 9:51 AM    [place]

## 2019-11-27 ENCOUNTER — Other Ambulatory Visit: Payer: Self-pay | Admitting: Cardiovascular Disease

## 2019-11-27 ENCOUNTER — Other Ambulatory Visit: Payer: Self-pay

## 2019-11-27 ENCOUNTER — Other Ambulatory Visit: Payer: PPO

## 2019-11-27 ENCOUNTER — Ambulatory Visit (HOSPITAL_COMMUNITY): Payer: PPO | Attending: Cardiovascular Disease

## 2019-11-27 DIAGNOSIS — Q231 Congenital insufficiency of aortic valve: Secondary | ICD-10-CM | POA: Diagnosis not present

## 2019-11-27 LAB — COMPREHENSIVE METABOLIC PANEL
ALT: 24 IU/L (ref 0–44)
AST: 16 IU/L (ref 0–40)
Albumin/Globulin Ratio: 1.9 (ref 1.2–2.2)
Albumin: 4.5 g/dL (ref 3.8–4.8)
Alkaline Phosphatase: 105 IU/L (ref 39–117)
BUN/Creatinine Ratio: 16 (ref 10–24)
BUN: 14 mg/dL (ref 8–27)
Bilirubin Total: 0.5 mg/dL (ref 0.0–1.2)
CO2: 23 mmol/L (ref 20–29)
Calcium: 9.7 mg/dL (ref 8.6–10.2)
Chloride: 101 mmol/L (ref 96–106)
Creatinine, Ser: 0.89 mg/dL (ref 0.76–1.27)
GFR calc Af Amer: 104 mL/min/{1.73_m2} (ref >59)
GFR calc non Af Amer: 90 mL/min/{1.73_m2} (ref >59)
Globulin, Total: 2.4 g/dL (ref 1.5–4.5)
Glucose: 100 mg/dL — ABNORMAL HIGH (ref 65–99)
Potassium: 4.6 mmol/L (ref 3.5–5.2)
Sodium: 138 mmol/L (ref 134–144)
Total Protein: 6.9 g/dL (ref 6.0–8.5)

## 2019-11-27 LAB — LIPID PANEL
Chol/HDL Ratio: 4.5 ratio (ref 0.0–5.0)
Cholesterol, Total: 202 mg/dL — ABNORMAL HIGH (ref 100–199)
HDL: 45 mg/dL (ref >39)
LDL Chol Calc (NIH): 137 mg/dL — ABNORMAL HIGH (ref 0–99)
Triglycerides: 111 mg/dL (ref 0–149)
VLDL Cholesterol Cal: 20 mg/dL (ref 5–40)

## 2019-11-29 ENCOUNTER — Ambulatory Visit: Payer: PPO | Attending: Internal Medicine

## 2019-11-29 DIAGNOSIS — Z23 Encounter for immunization: Secondary | ICD-10-CM | POA: Insufficient documentation

## 2019-11-29 NOTE — Progress Notes (Signed)
   Covid-19 Vaccination Clinic  Name:  Ryan Burton    MRN: [id number] DOB: [birth year]  11/29/2019  Mr. Press was observed post Covid-19 immunization for 15 minutes without incidence. He was provided with Vaccine Information Sheet and instruction to access the V-Safe system.   [name] was instructed to call 911 with any severe reactions post vaccine: Marland Kitchen Difficulty breathing  . Swelling of your face and throat  . A fast heartbeat  . A bad rash all over your body  . Dizziness and weakness    Immunizations Administered    Name Date Dose VIS Date Route   Pfizer COVID-19 Vaccine 11/29/2019  6:37 PM 0.3 mL 10/19/2019 Intramuscular   Manufacturer: [place]   Lot: [id number]   [place]: [id number]

## 2019-12-04 ENCOUNTER — Other Ambulatory Visit: Payer: Self-pay

## 2019-12-04 ENCOUNTER — Encounter: Payer: Self-pay | Admitting: Cardiovascular Disease

## 2019-12-04 ENCOUNTER — Ambulatory Visit (INDEPENDENT_AMBULATORY_CARE_PROVIDER_SITE_OTHER): Payer: PPO | Admitting: Cardiovascular Disease

## 2019-12-04 VITALS — BP 128/84 | HR 61 | Ht 70.0 in | Wt 173.2 lb

## 2019-12-04 DIAGNOSIS — Z79899 Other long term (current) drug therapy: Secondary | ICD-10-CM

## 2019-12-04 DIAGNOSIS — I712 Thoracic aortic aneurysm, without rupture: Secondary | ICD-10-CM

## 2019-12-04 DIAGNOSIS — I7121 Aneurysm of the ascending aorta, without rupture: Secondary | ICD-10-CM

## 2019-12-04 DIAGNOSIS — E7800 Pure hypercholesterolemia, unspecified: Secondary | ICD-10-CM | POA: Diagnosis not present

## 2019-12-04 DIAGNOSIS — Q231 Congenital insufficiency of aortic valve: Secondary | ICD-10-CM

## 2019-12-04 MED ORDER — ROSUVASTATIN CALCIUM 10 MG PO TABS: 10.0000 mg | ORAL_TABLET | Freq: Every day | ORAL | 3 refills | Status: DC

## 2019-12-04 NOTE — Progress Notes (Signed)
Cardiology Office Note   Date:  12/04/2019   ID:  Ryan Burton, DOB July 28, 1956, MRN [id number]  PCP:  Lavone Orn, MD  Cardiologist:   Skeet Latch, MD   No chief complaint on file.    History of Present Illness: Ryan Burton is a 64 y.o. male with a bicuspid aortic valve, mild ascending aorta aneurysm, moderate aortic regurgitation, and Crohn's disease who presents for follow-up.  Ryan Burton saw [name], NP on 09/07/16 and reported episods of dizzness and chest pain.   He was evaluated in clinic 09/21/16. He was noted to have a murmur on exam. He was referred for an echocardiogram 10/21/16 that revealed LVEF 60-65% with grade 1 diastolic dysfunction. He was also noted to have a functionally bicuspid aortic valve with moderate aortic regurgitation. He also had mild to moderate dilation of the aortic root measuring 4.4 cm.  He had a repeat echo 01/2017 and 03/2018 that were essentially unchanged.  Echo 10/2019 revealed moderate AR and the ascending aorta was 4.0 cm.  He wore a 48 hour Holter that showed occasional PVCs but was otherwise unremarkable.  He also had an ETT 10/2016 that was negative for ischemia.    At his last appointment Ryan Burton BP was running high, but he wanted to work on diet and exercise.  He exercises five or six days per week and has no exertional chest pain or shortness of breath.  He notes that he does not really exert himself much when he walks or uses his elliptical.  He has occasional episodes of chest pain at rest.  It never happens with exertion.  He does report having a history of GERD.  His blood pressure at home has been 110s-140s/60-90s.  Overall he has been feeling well.  He has not experienced any palpitations, lightheadedness, or dizziness.  He has been trying to limit his ice cream intake.   Past Medical History:  Diagnosis Date  . Aneurysm of ascending aorta ([place]) 10/25/2016   4.4cm by echo 10/21/16  . Atrial tachycardia  ([place]) 01/15/2017  . Bicuspid aortic valve 10/25/2016  . Chest pain   . Crohn's disease ([place])   . Dizziness   . Essential hypertension 09/04/2019  . Moderate aortic regurgitation 10/25/2016  . Palpitations 09/21/2016  . Pure hypercholesterolemia 09/04/2019  . PVC (premature ventricular contraction) 01/15/2017  . Shortness of breath 01/15/2017    Past Surgical History:  Procedure Laterality Date  . KIDNEY STONE SURGERY    . PTERYGIUM EXCISION    . SHOULDER SURGERY       Current Outpatient Medications  Medication Sig Dispense Refill  . cyanocobalamin 1000 MCG tablet Take 1,000 mcg by mouth daily.    . Multiple Vitamins-Minerals (MULTIVITAMIN WITH MINERALS) tablet Take 1 tablet by mouth daily.    . rosuvastatin (CRESTOR) 10 MG tablet Take 1 tablet (10 mg total) by mouth daily. 90 tablet 3   No current facility-administered medications for this visit.    Allergies:   Ivp dye [iodinated diagnostic agents] and Shellfish allergy    Social History:  The patient  reports that he has never smoked. He has never used smokeless tobacco. He reports current alcohol use. He reports that he does not use drugs.   Family History:  The patient's family history includes Atrial fibrillation in his mother; Dementia in his father; Stroke in his paternal grandfather.    ROS:  Please see the history of present illness.   Otherwise, review of systems are  positive for none.   All other systems are reviewed and negative.    PHYSICAL EXAM: VS:  BP 128/84   Pulse 61   Ht 5\' 10"  (1.778 m)   Wt 173 lb 3.2 oz (78.6 kg)   SpO2 98%   BMI 24.85 kg/m  , BMI Body mass index is 24.85 kg/m. GENERAL:  Well appearing HEENT: Pupils equal round and reactive, fundi not visualized, oral mucosa unremarkable NECK:  No jugular venous distention, waveform within normal limits, carotid upstroke brisk and symmetric, no bruits LUNGS:  Clear to auscultation bilaterally HEART:  RRR.  PMI not displaced or sustained,S1 and  S2 within normal limits, no S3, no S4, no clicks, no rubs, II/VI systolic and II/IV diastolic murmurs at the LUSB ABD:  Flat, positive bowel sounds normal in frequency in pitch, no bruits, no rebound, no guarding, no midline pulsatile mass, no hepatomegaly, no splenomegaly EXT:  2 plus pulses throughout, no edema, no cyanosis no clubbing SKIN:  No rashes no nodules NEURO:  Cranial nerves II through XII grossly intact, motor grossly intact throughout PSYCH:  Cognitively intact, oriented to person place and time   EKG:  EKG is not ordered today. 01/13/17: sinus rhythm.  Rate 60 bpm 03/21/18: Sinus bradycardia.  Rate 57 bpm. 09/04/19: Sinus bradycardia.  Rate 57 bpm.   Recent Labs: 11/27/2019: ALT 24; BUN 14; Creatinine, Ser 0.89; Potassium 4.6; Sodium 138   06/14/2019: Total cholesterol 221, triglycerides 112, HDL 44, LDL 155 Hemoglobin A1c 5.4% Potassium 4.4, creatinine 0.8  03/19/2015: Cholesterol 185, triglycerides 96, HDL 42, LDL 123  48 hour Holter 09/14/16:  Quality: Fair.  Baseline artifact. Predominant rhythm: sinus rhythm, sinus bradycardia Average heart rate: 70 bpm Max heart rate: 127 bpm Min heart rate: 40 bpm One pause 1.9 sec <1% PVCs Short runs of atrial tachycardia  ETT 10/21/16:  Blood pressure demonstrated a normal response to exercise.  There was no ST segment deviation noted during stress.  Clinically and electrically negative for ischemia  Exclellent exercise capacity  Echo 03/2018: Study Conclusions  - Left ventricle: The cavity size was normal. Wall thickness was   normal. Systolic function was normal. The estimated ejection   fraction was in the range of 55% to 60%. Wall motion was normal;   there were no regional wall motion abnormalities. Doppler   parameters are consistent with abnormal left ventricular   relaxation (grade 1 diastolic dysfunction). - Aortic valve: Bicuspid valve with fused left and right coronary   cusps. There was no  stenosis. There was mild regurgitation. - Aorta: Mildly dilated aortic root and ascending aorta. Aortic   root dimension: 43 mm (ED). Ascending aortic diameter: 41 mm (S). - Right ventricle: The cavity size was normal. Systolic function   was normal. - Tricuspid valve: Peak RV-RA gradient (S): 25 mm Hg. - Pulmonary arteries: PA peak pressure: 28 mm Hg (S). - Inferior vena cava: The vessel was normal in size. The   respirophasic diameter changes were in the normal range (>= 50%),   consistent with normal central venous pressure.  Impressions:  - Normal LV size with EF 55-60%. Normal RV size and systolic   function. Bicuspid aortic valve (fused left and right coronary   cusps) with mild aortic insufficiency.  Echo 01/31/17: Study Conclusions  - Left ventricle: The cavity size was normal. Wall thickness was   normal. Systolic function was normal. The estimated ejection   fraction was in the range of 55% to 60%. Wall motion was  normal;   there were no regional wall motion abnormalities. Doppler   parameters are consistent with abnormal left ventricular   relaxation (grade 1 diastolic dysfunction). - Aortic valve: Possible bicuspid valve with apparent fusion of the   right and left coronary cusps. There was no stenosis. There was   mild to moderate regurgitation. - Aorta: Mildly dilated aortic root. Aortic root dimension: 43 mm   (ED). Ascending aortic diameter: 39 mm (S). - Mitral valve: There was trivial regurgitation. - Right ventricle: The cavity size was normal. Systolic function   was normal. - Tricuspid valve: Peak RV-RA gradient (S): 24 mm Hg. - Pulmonary arteries: PA peak pressure: 27 mm Hg (S). - Inferior vena cava: The vessel was normal in size. The   respirophasic diameter changes were in the normal range (>= 50%),   consistent with normal central venous pressure.   Lipid Panel    Component Value Date/Time   CHOL 202 (H) 11/27/2019 0740   TRIG 111 11/27/2019 0740    HDL 45 11/27/2019 0740   CHOLHDL 4.5 11/27/2019 0740   LDLCALC 137 (H) 11/27/2019 0740      Wt Readings from Last 3 Encounters:  12/04/19 173 lb 3.2 oz (78.6 kg)  09/04/19 175 lb 6.4 oz (79.6 kg)  03/21/18 172 lb 6.4 oz (78.2 kg)      ASSESSMENT AND PLAN:  # Aortic regurgitation: # Bicuspid aortic valve: # Ascending aortic aneurysm: Ryan Burton has a bicuspid aortic valve and moderate aortic regurgitation. He is euvolemic and asymptomatic.  Aortic regurgitation is slightly worse than last time.  His aortic aneurysm is stable.  Repeat echo in 1 year.  He was reminded of symptoms such as shortness of breath or edema for which he would call us for sooner appointment.  # Essential hypertension: BP has been at the upper limit of normal.  He is going to work on increasing the intensity of his exercise in order to avoid medication.  # Pure hypercholesterolemia.  ASCVD 10-year risk 13.6%.  We will start rosuvastatin 10 mg daily.  Check lipids and a CMP in 6 to 8 weeks.  # PVCs:  Laboratory testing has been normal.  48 hour Holter showed one pause <2 seconds, several PVCs (<1%) and short runs of atrial tachycardia. He is asymptomatic.   # Chest pain:  Resolved.  ETT negative for ischemia 10/2016.     Current medicines are reviewed at length with the patient today.  The patient does not have concerns regarding medicines.  The following changes have been made:  no change  Labs/ tests ordered today include:   Orders Placed This Encounter  Procedures  . Lipid panel  . Hepatic function panel  . ECHOCARDIOGRAM COMPLETE     Disposition:   FU with Aronda Burford C. Oval Linsey, MD, [facility] in 1 year     Signed, Orabelle Rylee C. Oval Linsey, MD, [facility]  12/04/2019 3:41 PM    [place]

## 2019-12-04 NOTE — Patient Instructions (Signed)
Medication Instructions:  START- Crestor 10 mg by mouth daily  *If you need a refill on your cardiac medications before your next appointment, please call your pharmacy*  Lab Work: Fasting Lipid and Liver in 6-8 weeks  If you have labs (blood work) drawn today and your tests are completely normal, you will receive your results only by: Marland Kitchen MyChart Message (if you have MyChart) OR . A paper copy in the mail If you have any lab test that is abnormal or we need to change your treatment, we will call you to review the results.  Testing/Procedures: Your physician has requested that you have an echocardiogram in 1 year. Echocardiography is a painless test that uses sound waves to create images of your heart. It provides your doctor with information about the size and shape of your heart and how well your heart's chambers and valves are working. This procedure takes approximately one hour. There are no restrictions for this procedure.   Follow-Up: At [facility], you and your health needs are our priority.  As part of our continuing mission to provide you with exceptional heart care, we have created designated Provider Care Teams.  These Care Teams include your primary Cardiologist (physician) and Advanced Practice Providers (APPs -  Physician Assistants and Nurse Practitioners) who all work together to provide you with the care you need, when you need it.  Your next appointment:   1 year(s)  The format for your next appointment:   In Person  Provider:   Skeet Latch, MD

## 2019-12-14 ENCOUNTER — Ambulatory Visit: Payer: PPO

## 2019-12-20 ENCOUNTER — Ambulatory Visit: Payer: PPO | Attending: Internal Medicine

## 2019-12-20 DIAGNOSIS — Z23 Encounter for immunization: Secondary | ICD-10-CM | POA: Insufficient documentation

## 2019-12-20 NOTE — Progress Notes (Signed)
   Covid-19 Vaccination Clinic  Name:  Ryan Burton    MRN: [id number] DOB: 05/10/1956  12/20/2019  [name] was observed post Covid-19 immunization for 15 minutes without incidence. He was provided with Vaccine Information Sheet and instruction to access the V-Safe system.   [name] was instructed to call 911 with any severe reactions post vaccine: Marland Kitchen Difficulty breathing  . Swelling of your face and throat  . A fast heartbeat  . A bad rash all over your body  . Dizziness and weakness    Immunizations Administered    Name Date Dose VIS Date Route   Pfizer COVID-19 Vaccine 12/20/2019  5:19 PM 0.3 mL 10/19/2019 Intramuscular   Manufacturer: [place]   Lot: [id number]   [place]: [id number]

## 2020-01-07 DIAGNOSIS — H5213 Myopia, bilateral: Secondary | ICD-10-CM | POA: Diagnosis not present

## 2020-01-07 DIAGNOSIS — H2513 Age-related nuclear cataract, bilateral: Secondary | ICD-10-CM | POA: Diagnosis not present

## 2020-01-07 DIAGNOSIS — H40053 Ocular hypertension, bilateral: Secondary | ICD-10-CM | POA: Diagnosis not present

## 2020-01-07 DIAGNOSIS — H04123 Dry eye syndrome of bilateral lacrimal glands: Secondary | ICD-10-CM | POA: Diagnosis not present

## 2020-01-22 DIAGNOSIS — Z79899 Other long term (current) drug therapy: Secondary | ICD-10-CM | POA: Diagnosis not present

## 2020-01-22 DIAGNOSIS — E7800 Pure hypercholesterolemia, unspecified: Secondary | ICD-10-CM | POA: Diagnosis not present

## 2020-01-22 LAB — HEPATIC FUNCTION PANEL
ALT: 42 IU/L (ref 0–44)
AST: 27 IU/L (ref 0–40)
Albumin: 4.6 g/dL (ref 3.8–4.8)
Alkaline Phosphatase: 99 IU/L (ref 39–117)
Bilirubin Total: 0.5 mg/dL (ref 0.0–1.2)
Bilirubin, Direct: 0.15 mg/dL (ref 0.00–0.40)
Total Protein: 6.8 g/dL (ref 6.0–8.5)

## 2020-01-22 LAB — LIPID PANEL
Chol/HDL Ratio: 2.8 ratio (ref 0.0–5.0)
Cholesterol, Total: 133 mg/dL (ref 100–199)
HDL: 48 mg/dL (ref >39)
LDL Chol Calc (NIH): 69 mg/dL (ref 0–99)
Triglycerides: 81 mg/dL (ref 0–149)
VLDL Cholesterol Cal: 16 mg/dL (ref 5–40)

## 2020-01-24 DIAGNOSIS — L817 Pigmented purpuric dermatosis: Secondary | ICD-10-CM | POA: Diagnosis not present

## 2020-03-03 DIAGNOSIS — D1801 Hemangioma of skin and subcutaneous tissue: Secondary | ICD-10-CM | POA: Diagnosis not present

## 2020-03-03 DIAGNOSIS — D225 Melanocytic nevi of trunk: Secondary | ICD-10-CM | POA: Diagnosis not present

## 2020-03-03 DIAGNOSIS — L821 Other seborrheic keratosis: Secondary | ICD-10-CM | POA: Diagnosis not present

## 2020-03-03 DIAGNOSIS — D2362 Other benign neoplasm of skin of left upper limb, including shoulder: Secondary | ICD-10-CM | POA: Diagnosis not present

## 2020-03-03 DIAGNOSIS — L817 Pigmented purpuric dermatosis: Secondary | ICD-10-CM | POA: Diagnosis not present

## 2020-03-03 DIAGNOSIS — B351 Tinea unguium: Secondary | ICD-10-CM | POA: Diagnosis not present

## 2020-06-16 DIAGNOSIS — Z0000 Encounter for general adult medical examination without abnormal findings: Secondary | ICD-10-CM | POA: Diagnosis not present

## 2020-06-16 DIAGNOSIS — R21 Rash and other nonspecific skin eruption: Secondary | ICD-10-CM | POA: Diagnosis not present

## 2020-06-16 DIAGNOSIS — Z86010 Personal history of colonic polyps: Secondary | ICD-10-CM | POA: Diagnosis not present

## 2020-06-16 DIAGNOSIS — Z1331 Encounter for screening for depression: Secondary | ICD-10-CM | POA: Diagnosis not present

## 2020-06-16 DIAGNOSIS — E538 Deficiency of other specified B group vitamins: Secondary | ICD-10-CM | POA: Diagnosis not present

## 2020-06-16 DIAGNOSIS — Z23 Encounter for immunization: Secondary | ICD-10-CM | POA: Diagnosis not present

## 2020-06-16 DIAGNOSIS — I77810 Thoracic aortic ectasia: Secondary | ICD-10-CM | POA: Diagnosis not present

## 2020-06-16 DIAGNOSIS — E7800 Pure hypercholesterolemia, unspecified: Secondary | ICD-10-CM | POA: Diagnosis not present

## 2020-06-16 DIAGNOSIS — Q231 Congenital insufficiency of aortic valve: Secondary | ICD-10-CM | POA: Diagnosis not present

## 2020-08-11 ENCOUNTER — Telehealth: Payer: Self-pay | Admitting: Cardiovascular Disease

## 2020-08-11 DIAGNOSIS — I776 Arteritis, unspecified: Secondary | ICD-10-CM | POA: Diagnosis not present

## 2020-08-11 DIAGNOSIS — L817 Pigmented purpuric dermatosis: Secondary | ICD-10-CM | POA: Diagnosis not present

## 2020-08-11 DIAGNOSIS — L958 Other vasculitis limited to the skin: Secondary | ICD-10-CM | POA: Diagnosis not present

## 2020-08-11 DIAGNOSIS — Z79899 Other long term (current) drug therapy: Secondary | ICD-10-CM | POA: Diagnosis not present

## 2020-08-11 NOTE — Telephone Encounter (Signed)
Advised patient, verbalized understanding  

## 2020-08-11 NOTE — Telephone Encounter (Signed)
I think that is fine.

## 2020-08-11 NOTE — Telephone Encounter (Signed)
    Pt c/o medication issue:  1. Name of Medication:   rosuvastatin (CRESTOR) 10 MG tablet    2. How are you currently taking this medication (dosage and times per day)?  Take 1 tablet (10 mg total) by mouth daily.     3. Are you having a reaction (difficulty breathing--STAT)?  4. What is your medication issue? Pt said he was having skin irritation he saw his dermatologist and was told it the irritation might be coming from taking his rosuvastatin. His derma asked if Dr. Oval Linsey can take him off rosuvastatin for 3 mos.

## 2020-08-11 NOTE — Telephone Encounter (Signed)
Spoke with patient. Since January when patient started Rosuvastatin he has had skin irritation. Patient reports it was shortly after starting the medication that the irritation occurred. He has seen a dermatologist 2-3 times in the last 6 months regarding the skin irritation. Today he saw dermatology who recommended he speak to cardiologist about stopping Crestor for 3 months to see if this clears the condition. Will route message to Dr. Oval Linsey and primary nurse Rip Harbour for review. Patient aware of current status of message.

## 2020-08-23 ENCOUNTER — Ambulatory Visit: Payer: PPO | Attending: Internal Medicine

## 2020-08-23 DIAGNOSIS — Z23 Encounter for immunization: Secondary | ICD-10-CM

## 2020-08-23 NOTE — Progress Notes (Signed)
   Covid-19 Vaccination Clinic  Name:  Ryan Burton    MRN: [id number] DOB: [birth year]  08/23/2020  Ryan Burton was observed post Covid-19 immunization for 15 minutes without incident. He was provided with Vaccine Information Sheet and instruction to access the V-Safe system.   Ryan Burton was instructed to call 911 with any severe reactions post vaccine: Marland Kitchen Difficulty breathing  . Swelling of face and throat  . A fast heartbeat  . A bad rash all over body  . Dizziness and weakness

## 2020-08-23 NOTE — Progress Notes (Signed)
   Covid-19 Vaccination Clinic  Name:  JOSIYAH TOZZI    MRN: [id number] DOB: [birth year]  08/23/2020  Mr. Hofferber was observed post Covid-19 immunization for 15 minutes without incident. He was provided with Vaccine Information Sheet and instruction to access the V-Safe system.   Mr. Kagel was instructed to call 911 with any severe reactions post vaccine: Marland Kitchen Difficulty breathing  . Swelling of face and throat  . A fast heartbeat  . A bad rash all over body  . Dizziness and weakness

## 2020-09-26 DIAGNOSIS — Z23 Encounter for immunization: Secondary | ICD-10-CM | POA: Diagnosis not present

## 2020-10-08 DIAGNOSIS — Z1159 Encounter for screening for other viral diseases: Secondary | ICD-10-CM | POA: Diagnosis not present

## 2020-10-13 DIAGNOSIS — K5730 Diverticulosis of large intestine without perforation or abscess without bleeding: Secondary | ICD-10-CM | POA: Diagnosis not present

## 2020-10-13 DIAGNOSIS — Z86010 Personal history of colonic polyps: Secondary | ICD-10-CM | POA: Diagnosis not present

## 2020-10-13 DIAGNOSIS — K635 Polyp of colon: Secondary | ICD-10-CM | POA: Diagnosis not present

## 2020-10-13 DIAGNOSIS — D124 Benign neoplasm of descending colon: Secondary | ICD-10-CM | POA: Diagnosis not present

## 2020-10-15 DIAGNOSIS — D124 Benign neoplasm of descending colon: Secondary | ICD-10-CM | POA: Diagnosis not present

## 2020-11-18 ENCOUNTER — Other Ambulatory Visit: Payer: Self-pay

## 2020-11-18 ENCOUNTER — Ambulatory Visit (HOSPITAL_COMMUNITY): Payer: PPO | Attending: Cardiovascular Disease

## 2020-11-18 DIAGNOSIS — Q231 Congenital insufficiency of aortic valve: Secondary | ICD-10-CM

## 2020-11-18 DIAGNOSIS — I712 Thoracic aortic aneurysm, without rupture: Secondary | ICD-10-CM

## 2020-11-18 DIAGNOSIS — I7121 Aneurysm of the ascending aorta, without rupture: Secondary | ICD-10-CM

## 2020-11-18 LAB — ECHOCARDIOGRAM COMPLETE
AR max vel: 2.58 cm2
AV Area VTI: 2.73 cm2
AV Area mean vel: 2.57 cm2
AV Mean grad: 6.7 mmHg
AV Peak grad: 12.7 mmHg
Ao pk vel: 1.78 m/s
Area-P 1/2: 2.11 cm2
P 1/2 time: 866 msec
S' Lateral: 2.9 cm

## 2020-11-21 ENCOUNTER — Other Ambulatory Visit: Payer: Self-pay | Admitting: *Deleted

## 2020-11-21 DIAGNOSIS — I7121 Aneurysm of the ascending aorta, without rupture: Secondary | ICD-10-CM

## 2020-11-21 DIAGNOSIS — Q231 Congenital insufficiency of aortic valve: Secondary | ICD-10-CM

## 2020-11-21 DIAGNOSIS — I712 Thoracic aortic aneurysm, without rupture: Secondary | ICD-10-CM

## 2020-12-01 ENCOUNTER — Encounter: Payer: Self-pay | Admitting: Cardiovascular Disease

## 2020-12-01 ENCOUNTER — Ambulatory Visit: Payer: PPO | Admitting: Cardiovascular Disease

## 2020-12-01 ENCOUNTER — Other Ambulatory Visit: Payer: Self-pay

## 2020-12-01 VITALS — BP 138/86 | HR 54 | Ht 70.0 in | Wt 179.8 lb

## 2020-12-01 DIAGNOSIS — E7800 Pure hypercholesterolemia, unspecified: Secondary | ICD-10-CM | POA: Diagnosis not present

## 2020-12-01 DIAGNOSIS — Z5181 Encounter for therapeutic drug level monitoring: Secondary | ICD-10-CM

## 2020-12-01 DIAGNOSIS — Q231 Congenital insufficiency of aortic valve: Secondary | ICD-10-CM

## 2020-12-01 DIAGNOSIS — I712 Thoracic aortic aneurysm, without rupture, unspecified: Secondary | ICD-10-CM

## 2020-12-01 DIAGNOSIS — I7121 Aneurysm of the ascending aorta, without rupture: Secondary | ICD-10-CM

## 2020-12-01 MED ORDER — ATORVASTATIN CALCIUM 10 MG PO TABS: 10.0000 mg | ORAL_TABLET | Freq: Every day | ORAL | 3 refills | Status: DC

## 2020-12-01 NOTE — Patient Instructions (Addendum)
Medication Instructions:  START ATORVASTATIN 10 MG DAILY   *If you need a refill on your cardiac medications before your next appointment, please call your pharmacy*  Lab Work: FASTING LP/CMET IN 3 MONTHS   If you have labs (blood work) drawn today and your tests are completely normal, you will receive your results only by: Marland Kitchen MyChart Message (if you have MyChart) OR . A paper copy in the mail If you have any lab test that is abnormal or we need to change your treatment, we will call you to review the results.  Testing/Procedures: Your physician has requested that you have an echocardiogram. Echocardiography is a painless test that uses sound waves to create images of your heart. It provides your doctor with information about the size and shape of your heart and how well your heart's chambers and valves are working. This procedure takes approximately one hour. There are no restrictions for this procedure. TO BE DONE 11/2021  Non-Cardiac CT Angiography (CTA), is a special type of CT scan that uses a computer to produce multi-dimensional views of major blood vessels throughout the body. In CT angiography, a contrast material is injected through an IV to help visualize the blood vessels OF CHEST TO BE DONE 11/2021  CALL THE OFFICE IN December TO HAVE YOUR PREDNISONE SENT TO PHARMACY  YOU WILL NEED TO TAKE THIS PRIOR TO CT  Follow-Up: At [facility], you and your health needs are our priority.  As part of our continuing mission to provide you with exceptional heart care, we have created designated Provider Care Teams.  These Care Teams include your primary Cardiologist (physician) and Advanced Practice Providers (APPs -  Physician Assistants and Nurse Practitioners) who all work together to provide you with the care you need, when you need it.  We recommend signing up for the patient portal called "MyChart".  Sign up information is provided on this After Visit Summary.  MyChart is used to  connect with patients for Virtual Visits (Telemedicine).  Patients are able to view lab/test results, encounter notes, upcoming appointments, etc.  Non-urgent messages can be sent to your provider as well.   To learn more about what you can do with MyChart, go to [email or web address].    Your next appointment:   12 month(s)  You will receive a reminder letter in the mail two months in advance. If you don't receive a letter, please call our office to schedule the follow-up appointment.  The format for your next appointment:   In Person  Provider:   You may see DR [facility] or one of the following Advanced Practice Providers on your designated Care Team:    Kerin Ransom, PA-C  [place], [place]  Coletta Memos, [place]  DECREASE YOUR SALT INTAKE AND MONITOR YOUR BLOOD PRESSURE AT HOME WITH GOAL OF 130/80. CALL THE OFFICE IF IT IS NOT CONSISTENTLY  BELOW 130/80

## 2020-12-01 NOTE — Progress Notes (Signed)
Cardiology Office Note   Date:  12/01/2020   ID:  Ryan Burton, DOB 25-Sep-1956, MRN [id number]  PCP:  Lavone Orn, MD  Cardiologist:   Skeet Latch, MD   No chief complaint on file.    History of Present Illness: Ryan Burton is a 65 y.o. male with a bicuspid aortic valve, mild ascending aorta aneurysm, moderate aortic regurgitation, and Crohn's disease who presents for follow-up.  Ryan Burton saw [name], NP on 09/07/16 and reported episods of dizzness and chest pain.   He was evaluated in clinic 09/21/16. He was noted to have a murmur on exam. He was referred for an echocardiogram 10/21/16 that revealed LVEF 60-65% with grade 1 diastolic dysfunction. He was also noted to have a functionally bicuspid aortic valve with moderate aortic regurgitation. He also had mild to moderate dilation of the aortic root measuring 4.4 cm.  He had a repeat echo 01/2017 and 03/2018 that were essentially unchanged.  Echo 10/2019 revealed moderate AR and the ascending aorta was 4.0 cm.  He wore a 48 hour Holter that showed occasional PVCs but was otherwise unremarkable.  He also had an ETT 10/2016 that was negative for ischemia.    Ryan Burton blood pressure has been intermittently above goal.  He has typically been able to control it with diet and exercise.  His gym close due to COVID-19.  He is not exercising as regularly.  He did [place] of riding on the cart.  He does this about once every 3 days.  He has no exertional chest pain and his breathing has been stable.  He denies lower extremity edema, orthopnea, or PND.  He had a repeat echocardiogram 11/2020 that was unchanged from prior with moderate aortic regurgitation.  Ascending aorta was 4.0 cm.  Since his last appointment he had a diffuse rash.  We discovered that it was coming from rosuvastatin.  Once stopping the medicine his symptoms resolved.  He complains of some cramping in his legs and  stiffness at times.  This did not change on the statin.   Past Medical History:  Diagnosis Date  . Aneurysm of ascending aorta ([place]) 10/25/2016   4.4cm by echo 10/21/16  . Atrial tachycardia ([place]) 01/15/2017  . Bicuspid aortic valve 10/25/2016  . Chest pain   . Crohn's disease ([place])   . Dizziness   . Essential hypertension 09/04/2019  . Moderate aortic regurgitation 10/25/2016  . Palpitations 09/21/2016  . Pure hypercholesterolemia 09/04/2019  . PVC (premature ventricular contraction) 01/15/2017  . Shortness of breath 01/15/2017    Past Surgical History:  Procedure Laterality Date  . KIDNEY STONE SURGERY    . PTERYGIUM EXCISION    . SHOULDER SURGERY       Current Outpatient Medications  Medication Sig Dispense Refill  . cyanocobalamin 1000 MCG tablet Take 1,000 mcg by mouth daily.    . Multiple Vitamins-Minerals (MULTIVITAMIN WITH MINERALS) tablet Take 1 tablet by mouth daily.    . rosuvastatin (CRESTOR) 10 MG tablet 1 tablet     No current facility-administered medications for this visit.    Allergies:   Ivp dye [iodinated diagnostic agents] and Shellfish allergy    Social History:  The patient  reports that he has never smoked. He has never used smokeless tobacco. He reports current alcohol use. He reports that he does not use drugs.   Family History:  The patient's family history includes Atrial fibrillation in his mother; Dementia  in his father; Stroke in his paternal grandfather.    ROS:  Please see the history of present illness.   Otherwise, review of systems are positive for none.   All other systems are reviewed and negative.    PHYSICAL EXAM: VS:  BP 138/86   Pulse (!) 54   Ht 5\' 10"  (1.778 m)   Wt 179 lb 12.8 oz (81.6 kg)   BMI 25.80 kg/m  , BMI Body mass index is 25.8 kg/m. GENERAL:  Well appearing HEENT: Pupils equal round and reactive, fundi not visualized, oral mucosa unremarkable NECK:  No jugular venous distention, waveform within normal limits,  carotid upstroke brisk and symmetric, no bruits, no thyromegaly LYMPHATICS:  No cervical adenopathy LUNGS:  Clear to auscultation bilaterally HEART:  RRR.  PMI not displaced or sustained,S1 and S2 within normal limits, no S3, no S4, no clicks, no rubs, III/IV diastolic murmr ABD:  Flat, positive bowel sounds normal in frequency in pitch, no bruits, no rebound, no guarding, no midline pulsatile mass, no hepatomegaly, no splenomegaly EXT:  2 plus pulses throughout, no edema, no cyanosis no clubbing SKIN:  No rashes no nodules NEURO:  Cranial nerves II through XII grossly intact, motor grossly intact throughout PSYCH:  Cognitively intact, oriented to person place and time   EKG:  EKG is ordered today. 01/13/17: sinus rhythm.  Rate 60 bpm 03/21/18: Sinus bradycardia.  Rate 57 bpm. 09/04/19: Sinus bradycardia.  Rate 57 bpm. 12/01/20: Sinus bradycardia.  Rate 54 bpm.  LVH  Recent Labs: 01/22/2020: ALT 42   06/14/2019: Total cholesterol 221, triglycerides 112, HDL 44, LDL 155 Hemoglobin A1c 5.4% Potassium 4.4, creatinine 0.8  03/19/2015: Cholesterol 185, triglycerides 96, HDL 42, LDL 123  48 hour Holter 09/14/16:  Quality: Fair.  Baseline artifact. Predominant rhythm: sinus rhythm, sinus bradycardia Average heart rate: 70 bpm Max heart rate: 127 bpm Min heart rate: 40 bpm One pause 1.9 sec <1% PVCs Short runs of atrial tachycardia  ETT 10/21/16:  Blood pressure demonstrated a normal response to exercise.  There was no ST segment deviation noted during stress.  Clinically and electrically negative for ischemia  Exclellent exercise capacity  Echo 03/2018: Study Conclusions  - Left ventricle: The cavity size was normal. Wall thickness was   normal. Systolic function was normal. The estimated ejection   fraction was in the range of 55% to 60%. Wall motion was normal;   there were no regional wall motion abnormalities. Doppler   parameters are consistent with abnormal left  ventricular   relaxation (grade 1 diastolic dysfunction). - Aortic valve: Bicuspid valve with fused left and right coronary   cusps. There was no stenosis. There was mild regurgitation. - Aorta: Mildly dilated aortic root and ascending aorta. Aortic   root dimension: 43 mm (ED). Ascending aortic diameter: 41 mm (S). - Right ventricle: The cavity size was normal. Systolic function   was normal. - Tricuspid valve: Peak RV-RA gradient (S): 25 mm Hg. - Pulmonary arteries: PA peak pressure: 28 mm Hg (S). - Inferior vena cava: The vessel was normal in size. The   respirophasic diameter changes were in the normal range (>= 50%),   consistent with normal central venous pressure.  Impressions:  - Normal LV size with EF 55-60%. Normal RV size and systolic   function. Bicuspid aortic valve (fused left and right coronary   cusps) with mild aortic insufficiency.  Echo 01/31/17: Study Conclusions  - Left ventricle: The cavity size was normal. Wall thickness was  normal. Systolic function was normal. The estimated ejection   fraction was in the range of 55% to 60%. Wall motion was normal;   there were no regional wall motion abnormalities. Doppler   parameters are consistent with abnormal left ventricular   relaxation (grade 1 diastolic dysfunction). - Aortic valve: Possible bicuspid valve with apparent fusion of the   right and left coronary cusps. There was no stenosis. There was   mild to moderate regurgitation. - Aorta: Mildly dilated aortic root. Aortic root dimension: 43 mm   (ED). Ascending aortic diameter: 39 mm (S). - Mitral valve: There was trivial regurgitation. - Right ventricle: The cavity size was normal. Systolic function   was normal. - Tricuspid valve: Peak RV-RA gradient (S): 24 mm Hg. - Pulmonary arteries: PA peak pressure: 27 mm Hg (S). - Inferior vena cava: The vessel was normal in size. The   respirophasic diameter changes were in the normal range (>= 50%),    consistent with normal central venous pressure.   Lipid Panel    Component Value Date/Time   CHOL 133 01/22/2020 0830   TRIG 81 01/22/2020 0830   HDL 48 01/22/2020 0830   CHOLHDL 2.8 01/22/2020 0830   LDLCALC 69 01/22/2020 0830      Wt Readings from Last 3 Encounters:  12/01/20 179 lb 12.8 oz (81.6 kg)  12/04/19 173 lb 3.2 oz (78.6 kg)  09/04/19 175 lb 6.4 oz (79.6 kg)      ASSESSMENT AND PLAN:  # Aortic regurgitation: # Bicuspid aortic valve: # Ascending aortic aneurysm: Ryan Burton has a bicuspid aortic valve and moderate aortic regurgitation. He is euvolemic and asymptomatic.  Aortic regurgitation remains moderate, unchanged from prior.  His ascending aortic aneurysm is stable at 4.1 cm.  This is up slightly from 4.0 cm last year.  Next year we will get a CT-A of the chest to look at his aorta and repeat his echo in 1 year as well.  # Essential hypertension: BP has been intermittently elevated.  Lately slightly above his goal of 130/80.  We did discuss the importance of keeping this controlled to prevent expansion of his aneurysm.  He is not on a beta-blocker due to bradycardia baseline.  He is going to start back exercising more regularly and limit the sodium in his diet.  He will start checking his blood pressure more regularly at home and call us if it is running greater than 130/80.  # Pure hypercholesterolemia.  ASCVD 10-year risk 13.6%.  He has an allergy to rosuvastatin.  We will start atorvastatin 10 mg.  Repeat lipids and a CMP in 3 months.  # PVCs:  Laboratory testing has been normal.  48 hour Holter showed one pause <2 seconds, several PVCs (<1%) and short runs of atrial tachycardia. He is asymptomatic.   # Chest pain:  Resolved.  ETT negative for ischemia 10/2016.     Current medicines are reviewed at length with the patient today.  The patient does not have concerns regarding medicines.  The following changes have been made:  no change  Labs/ tests ordered  today include:   No orders of the defined types were placed in this encounter.    Disposition:   FU with Aalyiah Camberos C. Oval Linsey, MD, [facility] in 1 year     Signed, Dalary Hollar C. Oval Linsey, MD, [facility]  12/01/2020 10:58 AM    [facility]

## 2021-01-08 DIAGNOSIS — H04123 Dry eye syndrome of bilateral lacrimal glands: Secondary | ICD-10-CM | POA: Diagnosis not present

## 2021-01-08 DIAGNOSIS — H1789 Other corneal scars and opacities: Secondary | ICD-10-CM | POA: Diagnosis not present

## 2021-01-08 DIAGNOSIS — H2513 Age-related nuclear cataract, bilateral: Secondary | ICD-10-CM | POA: Diagnosis not present

## 2021-01-08 DIAGNOSIS — H524 Presbyopia: Secondary | ICD-10-CM | POA: Diagnosis not present

## 2021-02-17 DIAGNOSIS — E7800 Pure hypercholesterolemia, unspecified: Secondary | ICD-10-CM | POA: Diagnosis not present

## 2021-02-17 DIAGNOSIS — Z5181 Encounter for therapeutic drug level monitoring: Secondary | ICD-10-CM | POA: Diagnosis not present

## 2021-02-17 LAB — COMPREHENSIVE METABOLIC PANEL
ALT: 22 IU/L (ref 0–44)
AST: 19 IU/L (ref 0–40)
Albumin/Globulin Ratio: 2 (ref 1.2–2.2)
Albumin: 4.5 g/dL (ref 3.8–4.8)
Alkaline Phosphatase: 100 IU/L (ref 44–121)
BUN/Creatinine Ratio: 20 (ref 10–24)
BUN: 17 mg/dL (ref 8–27)
Bilirubin Total: 0.3 mg/dL (ref 0.0–1.2)
CO2: 22 mmol/L (ref 20–29)
Calcium: 9 mg/dL (ref 8.6–10.2)
Chloride: 103 mmol/L (ref 96–106)
Creatinine, Ser: 0.83 mg/dL (ref 0.76–1.27)
Globulin, Total: 2.3 g/dL (ref 1.5–4.5)
Glucose: 102 mg/dL — ABNORMAL HIGH (ref 65–99)
Potassium: 4.6 mmol/L (ref 3.5–5.2)
Sodium: 138 mmol/L (ref 134–144)
Total Protein: 6.8 g/dL (ref 6.0–8.5)
eGFR: 97 mL/min/{1.73_m2} (ref >59)

## 2021-02-17 LAB — LIPID PANEL
Chol/HDL Ratio: 3.1 ratio (ref 0.0–5.0)
Cholesterol, Total: 153 mg/dL (ref 100–199)
HDL: 49 mg/dL (ref >39)
LDL Chol Calc (NIH): 92 mg/dL (ref 0–99)
Triglycerides: 59 mg/dL (ref 0–149)
VLDL Cholesterol Cal: 12 mg/dL (ref 5–40)

## 2021-03-05 DIAGNOSIS — D1801 Hemangioma of skin and subcutaneous tissue: Secondary | ICD-10-CM | POA: Diagnosis not present

## 2021-03-05 DIAGNOSIS — D225 Melanocytic nevi of trunk: Secondary | ICD-10-CM | POA: Diagnosis not present

## 2021-03-05 DIAGNOSIS — L817 Pigmented purpuric dermatosis: Secondary | ICD-10-CM | POA: Diagnosis not present

## 2021-03-05 DIAGNOSIS — L821 Other seborrheic keratosis: Secondary | ICD-10-CM | POA: Diagnosis not present

## 2021-03-05 DIAGNOSIS — L814 Other melanin hyperpigmentation: Secondary | ICD-10-CM | POA: Diagnosis not present

## 2021-03-05 DIAGNOSIS — L570 Actinic keratosis: Secondary | ICD-10-CM | POA: Diagnosis not present

## 2021-03-05 DIAGNOSIS — D2271 Melanocytic nevi of right lower limb, including hip: Secondary | ICD-10-CM | POA: Diagnosis not present

## 2021-03-05 DIAGNOSIS — D2362 Other benign neoplasm of skin of left upper limb, including shoulder: Secondary | ICD-10-CM | POA: Diagnosis not present

## 2021-03-05 DIAGNOSIS — L718 Other rosacea: Secondary | ICD-10-CM | POA: Diagnosis not present

## 2021-03-19 MED ORDER — ATORVASTATIN CALCIUM 10 MG PO TABS: ORAL_TABLET | ORAL | 3 refills | Status: DC

## 2021-07-02 DIAGNOSIS — Z0000 Encounter for general adult medical examination without abnormal findings: Secondary | ICD-10-CM | POA: Diagnosis not present

## 2021-07-02 DIAGNOSIS — E7800 Pure hypercholesterolemia, unspecified: Secondary | ICD-10-CM | POA: Diagnosis not present

## 2021-07-02 DIAGNOSIS — Z23 Encounter for immunization: Secondary | ICD-10-CM | POA: Diagnosis not present

## 2021-07-02 DIAGNOSIS — Q231 Congenital insufficiency of aortic valve: Secondary | ICD-10-CM | POA: Diagnosis not present

## 2021-07-02 DIAGNOSIS — Z125 Encounter for screening for malignant neoplasm of prostate: Secondary | ICD-10-CM | POA: Diagnosis not present

## 2021-07-02 DIAGNOSIS — I351 Nonrheumatic aortic (valve) insufficiency: Secondary | ICD-10-CM | POA: Diagnosis not present

## 2021-07-02 DIAGNOSIS — Z1389 Encounter for screening for other disorder: Secondary | ICD-10-CM | POA: Diagnosis not present

## 2021-07-02 DIAGNOSIS — R030 Elevated blood-pressure reading, without diagnosis of hypertension: Secondary | ICD-10-CM | POA: Diagnosis not present

## 2021-07-16 ENCOUNTER — Encounter (HOSPITAL_BASED_OUTPATIENT_CLINIC_OR_DEPARTMENT_OTHER): Payer: Self-pay

## 2021-11-11 ENCOUNTER — Other Ambulatory Visit: Payer: Self-pay | Admitting: Cardiovascular Disease

## 2021-11-11 DIAGNOSIS — I7121 Aneurysm of the ascending aorta, without rupture: Secondary | ICD-10-CM

## 2021-11-11 DIAGNOSIS — Q231 Congenital insufficiency of aortic valve: Secondary | ICD-10-CM

## 2021-11-11 DIAGNOSIS — I712 Thoracic aortic aneurysm, without rupture, unspecified: Secondary | ICD-10-CM

## 2021-11-17 ENCOUNTER — Ambulatory Visit (HOSPITAL_COMMUNITY): Payer: PPO

## 2021-11-19 ENCOUNTER — Other Ambulatory Visit (HOSPITAL_BASED_OUTPATIENT_CLINIC_OR_DEPARTMENT_OTHER): Payer: Self-pay | Admitting: *Deleted

## 2021-11-19 DIAGNOSIS — Q231 Congenital insufficiency of aortic valve: Secondary | ICD-10-CM

## 2021-11-19 DIAGNOSIS — I7121 Aneurysm of the ascending aorta, without rupture: Secondary | ICD-10-CM

## 2021-11-25 ENCOUNTER — Telehealth (HOSPITAL_COMMUNITY): Payer: Self-pay | Admitting: Emergency Medicine

## 2021-11-25 ENCOUNTER — Other Ambulatory Visit: Payer: Self-pay

## 2021-11-25 ENCOUNTER — Encounter (HOSPITAL_BASED_OUTPATIENT_CLINIC_OR_DEPARTMENT_OTHER): Payer: Self-pay | Admitting: Cardiovascular Disease

## 2021-11-25 ENCOUNTER — Telehealth: Payer: Self-pay

## 2021-11-25 ENCOUNTER — Ambulatory Visit (HOSPITAL_BASED_OUTPATIENT_CLINIC_OR_DEPARTMENT_OTHER): Payer: PPO | Admitting: Cardiovascular Disease

## 2021-11-25 ENCOUNTER — Encounter (HOSPITAL_COMMUNITY): Payer: Self-pay

## 2021-11-25 DIAGNOSIS — E7800 Pure hypercholesterolemia, unspecified: Secondary | ICD-10-CM | POA: Diagnosis not present

## 2021-11-25 DIAGNOSIS — I351 Nonrheumatic aortic (valve) insufficiency: Secondary | ICD-10-CM | POA: Diagnosis not present

## 2021-11-25 DIAGNOSIS — Q231 Congenital insufficiency of aortic valve: Secondary | ICD-10-CM | POA: Diagnosis not present

## 2021-11-25 DIAGNOSIS — R0789 Other chest pain: Secondary | ICD-10-CM | POA: Diagnosis not present

## 2021-11-25 DIAGNOSIS — R6889 Other general symptoms and signs: Secondary | ICD-10-CM | POA: Diagnosis not present

## 2021-11-25 DIAGNOSIS — I10 Essential (primary) hypertension: Secondary | ICD-10-CM

## 2021-11-25 DIAGNOSIS — I7121 Aneurysm of the ascending aorta, without rupture: Secondary | ICD-10-CM | POA: Diagnosis not present

## 2021-11-25 HISTORY — DX: Other chest pain: R07.89

## 2021-11-25 HISTORY — DX: Other general symptoms and signs: R68.89

## 2021-11-25 MED ORDER — PREDNISONE 50 MG PO TABS: ORAL_TABLET | ORAL | 0 refills | Status: DC

## 2021-11-25 MED ORDER — DIPHENHYDRAMINE HCL 50 MG PO TABS: ORAL_TABLET | ORAL | 0 refills | Status: DC

## 2021-11-25 MED ORDER — VALSARTAN 80 MG PO TABS: 80.0000 mg | ORAL_TABLET | Freq: Every day | ORAL | 3 refills | Status: DC

## 2021-11-25 MED ORDER — METOPROLOL TARTRATE 25 MG PO TABS: ORAL_TABLET | ORAL | 0 refills | Status: DC

## 2021-11-25 NOTE — Telephone Encounter (Signed)
Calling patient to schedule CCTA AND CT chest aorta together at [facility] to minimize contrast and radiation to patient since hes allergic to contrast media.   New appt wed 12/02/21 at 9:30a (arrival 9:00a)  Sending contrast allergy 13 hr prep to pharm on file.   Sending mychart message with CTA instructions on how to prepare.  Ryan Bond RN Navigator Cardiac Imaging [facility] [phone] Office  [phone] Cell

## 2021-11-25 NOTE — Assessment & Plan Note (Signed)
He is euvolemic.  Echo next week.

## 2021-11-25 NOTE — Patient Instructions (Addendum)
Medication Instructions:  START VALSARTAN 80 MG DAILY   TAKE METOPROLOL 25 MG 1 TABLET 2 HOURS PRIOR TO CT  IF YOU DO NOT GET THE PREDNISONE FOR CT THAT WAS TO BE CALLED IN CALL THE OFFICE TO FOLLOW UP   *If you need a refill on your cardiac medications before your next appointment, please call your pharmacy*  Lab Work: FASTING LP/CMET/TSH WHEN YOU GET YOUR ECHO   If you have labs (blood work) drawn today and your tests are completely normal, you will receive your results only by: MyChart Message (if you have MyChart) OR A paper copy in the mail If you have any lab test that is abnormal or we need to change your treatment, we will call you to review the results.  Testing/Procedures: Your physician has requested that you have cardiac CT. Cardiac computed tomography (CT) is a painless test that uses an x-ray machine to take clear, detailed pictures of your heart. For further information please visit [email or web address]. Please follow instruction sheet as given. WILL TRY TO GET THIS ADDED TO NEXT WEEKS CT   Follow-Up: At [facility], you and your health needs are our priority.  As part of our continuing mission to provide you with exceptional heart care, we have created designated Provider Care Teams.  These Care Teams include your primary Cardiologist (physician) and Advanced Practice Providers (APPs -  Physician Assistants and Nurse Practitioners) who all work together to provide you with the care you need, when you need it.  We recommend signing up for the patient portal called "MyChart".  Sign up information is provided on this After Visit Summary.  MyChart is used to connect with patients for Virtual Visits (Telemedicine).  Patients are able to view lab/test results, encounter notes, upcoming appointments, etc.  Non-urgent messages can be sent to your provider as well.   To learn more about what you can do with MyChart, go to [email or web address].    Your next appointment:   4  week(s)  The format for your next appointment:   In Person  Provider:   Laurann Montana, NP  Your physician recommends that you schedule a follow-up appointment in: [place]    Other Instructions  MONITOR AND LOG YOUR BLOOD PRESSURE DAILY. BRING READINGS AND MACHINE TO FOLLOW UP WITH [place]    Your cardiac CT will be scheduled at one of the below locations:   [facility] [address] [place], [place] [address] [phone]  [facility] [address] [place],  [address] [phone]  If scheduled at [facility], please arrive at the [facility] main entrance (entrance A) of [facility] 30 minutes prior to test start time. You can use the FREE valet parking offered at the main entrance (encouraged to control the heart rate for the test) Proceed to the [facility] Radiology Department (first floor) to check-in and test prep.  If scheduled at [facility], please arrive 15 mins early for check-in and test prep.  Please follow these instructions carefully (unless otherwise directed):  Hold all erectile dysfunction medications at least 3 days (72 hrs) prior to test.  On the Night Before the Test: Be sure to Drink plenty of water. Do not consume any caffeinated/decaffeinated beverages or chocolate 12 hours prior to your test. Do not take any antihistamines 12 hours prior to your test. If the patient has contrast allergy: Patient will need a prescription  for Prednisone and very clear instructions (as follows): Prednisone 50 mg - take 13 hours prior to test Take another Prednisone 50 mg 7 hours prior to test Take another Prednisone 50 mg 1 hour prior to test Take Benadryl 50 mg 1 hour prior to test Patient must complete all four doses of above prophylactic medications. Patient will need a ride after test due to Benadryl.  On the Day of the  Test: Drink plenty of water until 1 hour prior to the test. Do not eat any food 4 hours prior to the test. You may take your regular medications prior to the test.  Take metoprolol (Lopressor) two hours prior to test. HOLD Furosemide/Hydrochlorothiazide morning of the test. FEMALES- please wear underwire-free bra if available, avoid dresses & tight clothing      After the Test: Drink plenty of water. After receiving IV contrast, you may experience a mild flushed feeling. This is normal. On occasion, you may experience a mild rash up to 24 hours after the test. This is not dangerous. If this occurs, you can take Benadryl 25 mg and increase your fluid intake. If you experience trouble breathing, this can be serious. If it is severe call 911 IMMEDIATELY. If it is mild, please call our office. If you take any of these medications: Glipizide/Metformin, Avandament, Glucavance, please do not take 48 hours after completing test unless otherwise instructed.  We will call to schedule your test 2-4 weeks out understanding that some insurance companies will need an authorization prior to the service being performed.   For non-scheduling related questions, please contact the cardiac imaging nurse navigator should you have any questions/concerns: Marchia Bond, Cardiac Imaging Nurse Navigator Gordy Clement, Cardiac Imaging Nurse Navigator [place] [facility] Direct Office Dial: [phone]   For scheduling needs, including cancellations and rescheduling, please call [place], [phone].  Cardiac CT Angiogram A cardiac CT angiogram is a procedure to look at the heart and the area around the heart. It may be done to help find the cause of chest pains or other symptoms of heart disease. During this procedure, a substance called contrast dye is injected into the blood vessels in the area to be checked. A large X-ray machine, called a CT scanner, then takes detailed pictures of the  heart and the surrounding area. The procedure is also sometimes called a coronary CT angiogram, coronary artery scanning, or CTA. A cardiac CT angiogram allows the health care provider to see how well blood is flowing to and from the heart. The health care provider will be able to see if there are any problems, such as: Blockage or narrowing of the coronary arteries in the heart. Fluid around the heart. Signs of weakness or disease in the muscles, valves, and tissues of the heart. Tell a health care provider about: Any allergies you have. This is especially important if you have had a previous allergic reaction to contrast dye. All medicines you are taking, including vitamins, herbs, eye drops, creams, and over-the-counter medicines. Any blood disorders you have. Any surgeries you have had. Any medical conditions you have. Whether you are pregnant or may be pregnant. Any anxiety disorders, chronic pain, or other conditions you have that may increase your stress or prevent you from lying still. What are the risks? Generally, this is a safe procedure. However, problems may occur, including: Bleeding. Infection. Allergic reactions to medicines or dyes. Damage to other structures or organs. Kidney damage from the contrast dye that is used. Increased  risk of cancer from radiation exposure. This risk is low. Talk with your health care provider about: The risks and benefits of testing. How you can receive the lowest dose of radiation. What happens before the procedure? Wear comfortable clothing and remove any jewelry, glasses, dentures, and hearing aids. Follow instructions from your health care provider about eating and drinking. This may include: For 12 hours before the procedure -- avoid caffeine. This includes tea, coffee, soda, energy drinks, and diet pills. Drink plenty of water or other fluids that do not have caffeine in them. Being well hydrated can prevent complications. For 4-6 hours  before the procedure -- stop eating and drinking. The contrast dye can cause nausea, but this is less likely if your stomach is empty. Ask your health care provider about changing or stopping your regular medicines. This is especially important if you are taking diabetes medicines, blood thinners, or medicines to treat problems with erections (erectile dysfunction). What happens during the procedure?  Hair on your chest may need to be removed so that small sticky patches called electrodes can be placed on your chest. These will transmit information that helps to monitor your heart during the procedure. An IV will be inserted into one of your veins. You might be given a medicine to control your heart rate during the procedure. This will help to ensure that good images are obtained. You will be asked to lie on an exam table. This table will slide in and out of the CT machine during the procedure. Contrast dye will be injected into the IV. You might feel warm, or you may get a metallic taste in your mouth. You will be given a medicine called nitroglycerin. This will relax or dilate the arteries in your heart. The table that you are lying on will move into the CT machine tunnel for the scan. The person running the machine will give you instructions while the scans are being done. You may be asked to: Keep your arms above your head. Hold your breath. Stay very still, even if the table is moving. When the scanning is complete, you will be moved out of the machine. The IV will be removed. The procedure may vary among health care providers and hospitals. What can I expect after the procedure? After your procedure, it is common to have: A metallic taste in your mouth from the contrast dye. A feeling of warmth. A headache from the nitroglycerin. Follow these instructions at home: Take over-the-counter and prescription medicines only as told by your health care provider. If you are told, drink enough  fluid to keep your urine pale yellow. This will help to flush the contrast dye out of your body. Most people can return to their normal activities right after the procedure. Ask your health care provider what activities are safe for you. It is up to you to get the results of your procedure. Ask your health care provider, or the department that is doing the procedure, when your results will be ready. Keep all follow-up visits as told by your health care provider. This is important. Contact a health care provider if: You have any symptoms of allergy to the contrast dye. These include: Shortness of breath. Rash or hives. A racing heartbeat. Summary A cardiac CT angiogram is a procedure to look at the heart and the area around the heart. It may be done to help find the cause of chest pains or other symptoms of heart disease. During this procedure, a large  X-ray machine, called a CT scanner, takes detailed pictures of the heart and the surrounding area after a contrast dye has been injected into blood vessels in the area. Ask your health care provider about changing or stopping your regular medicines before the procedure. This is especially important if you are taking diabetes medicines, blood thinners, or medicines to treat erectile dysfunction. If you are told, drink enough fluid to keep your urine pale yellow. This will help to flush the contrast dye out of your body. This information is not intended to replace advice given to you by your health care provider. Make sure you discuss any questions you have with your health care provider. Document Revised: 07/08/2021 Document Reviewed: 06/20/2019 Elsevier Patient Education  2022 [employer].

## 2021-11-25 NOTE — Assessment & Plan Note (Signed)
Check TSH 

## 2021-11-25 NOTE — Assessment & Plan Note (Signed)
He didn't tolerate rosuvastatin and atorvastatin dose was reduced due to rash.  He will come back for fasting lipids and a CMP.

## 2021-11-25 NOTE — Assessment & Plan Note (Signed)
Lately Ryan Burton has been experiencing chest tightness.  He is unsure whether it is worse with exertion.  He is already getting a chest CT-A of the aorta next week.  We will get a coronary CTA at that time to evaluate for ischemia.  I suspect it is related to either his poorly controlled hypertension or his recent upper respiratory infection, however given his risk factors will need to rule out ischemic heart disease.

## 2021-11-25 NOTE — Assessment & Plan Note (Signed)
Blood pressure is poorly controlled both here and at home.  I suspect is related to his recent lifestyle changes.  He is not exercising as much and he is undergoing a kitchen renovation so he is not cooking and eating out more.  Given his ascending aortic aneurysm, we need to be aggressive about his blood pressure.  Start valsartan 80 mg daily.  Check a CMP in a week.  We discussed the fact that hopefully this will be short-term if his blood pressure comes down once he gets back to his usual lifestyle.  He will check his pressures and bring to follow-up next month.

## 2021-11-25 NOTE — Progress Notes (Signed)
Cardiology Office Note  Date:  11/25/2021   ID:  Ryan Burton, DOB 02-Apr-1956, MRN [id number]  PCP:  Lavone Orn, MD  Cardiologist:   Skeet Latch, MD   No chief complaint on file.   History of Present Illness: Ryan Burton is a 66 y.o. male with a bicuspid aortic valve, mild ascending aorta aneurysm, moderate aortic regurgitation, and Crohn's disease who presents for follow-up.  Ryan Burton saw [name], NP on 09/07/16 and reported episods of dizzness and chest pain.   He was evaluated in clinic 09/21/16. He was noted to have a murmur on exam. He was referred for an echocardiogram 10/21/16 that revealed LVEF 60-65% with grade 1 diastolic dysfunction. He was also noted to have a functionally bicuspid aortic valve with moderate aortic regurgitation. He also had mild to moderate dilation of the aortic root measuring 4.4 cm.  He had a repeat echo 01/2017 and 03/2018 that were essentially unchanged.  Echo 10/2019 revealed moderate AR and the ascending aorta was 4.0 cm.  He wore a 48 hour Holter that showed occasional PVCs but was otherwise unremarkable.  He also had an ETT 10/2016 that was negative for ischemia.    Ryan Burton had been intermittently above goal.  He was typically able to control it with diet and exercise. He had a repeat echocardiogram 11/2020 that was unchanged from prior with moderate aortic regurgitation.  Ascending aorta was 4.0 cm.  He reported a diffuse rash that was discovered to be due to rosuvastatin. After stopping the statin his rash resolved. He also complained of leg cramping and stiffness at times that was unchanged while on the statin.   At his last appointment Ryan Burton was started on 10 mg atorvastatin. He messaged the office 03/12/2021 and reported developing dry/chapped skin on his knees, hands, and ankles. This was the same rash he experienced while on rosuvastatin previously. He was advised to stop atorvastatin for a couple  weeks, then start taking mg atorvastatin on Mondays, Wednesdays, and Fridays. He expressed understanding and stopped atorvastatin 03/19/21 for 2 weeks. A repeat echocardiogram was ordered 11/2021 but has not yet been completed.  Today, he is feeling pretty good. He presents a BP log, which generally shows more well-controlled readings recently such as 110s-130s, and sometimes in the 140s. At this time he is feeling "a little bit" of central chest Burton that he describes as a little bit of discomfort or tightness. This has occurred randomly at times, but does not seem to change with movement. Of note, he states this may be related to his recent cold and residual congestion. Over the past year, he has noticed that he is unable to tolerate coldness as well as he did previously. He is unsure if this is related to age or his heart. Currently he is renovating his kitchen, and has not been on his normal diet or exercise routines in the past 1.5 months. He has been ordering out more often. He tries to eat apples, granola, sandwiches, and salads with a protein when he is able. About 2 weeks ago he was sick with a head cold, and feeling lethargic. Combined with rainy and cold weather he has not been motivated to exercise lately. He denies any palpitations, or shortness of breath. No lightheadedness, headaches, syncope, orthopnea, PND, lower extremity edema or exertional symptoms.   Past Medical History:  Diagnosis Date   Aneurysm of ascending aorta 10/25/2016   4.4cm by echo 10/21/16   Atrial tachycardia (  [place]) 01/15/2017   Bicuspid aortic valve 10/25/2016   Chest pain    Chest tightness 11/25/2021   Cold intolerance 11/25/2021   Crohn's disease ([place])    Dizziness    Essential hypertension 09/04/2019   Moderate aortic regurgitation 10/25/2016   Palpitations 09/21/2016   Pure hypercholesterolemia 09/04/2019   PVC (premature ventricular contraction) 01/15/2017   Shortness of breath 01/15/2017    Past Surgical  History:  Procedure Laterality Date   KIDNEY STONE SURGERY     PTERYGIUM EXCISION     SHOULDER SURGERY       Current Outpatient Medications  Medication Sig Dispense Refill   atorvastatin (LIPITOR) 10 MG tablet TAKE 1/2 TABLET BY MOUTH Monday, Wednesday, AND Thursday ONLY (Patient taking differently: TAKE 1/2 TABLET BY MOUTH Monday, Wednesday, AND Friday ONLY) 90 tablet 3   cyanocobalamin 1000 MCG tablet Take 1,000 mcg by mouth daily.     Multiple Vitamins-Minerals (MULTIVITAMIN WITH MINERALS) tablet Take 1 tablet by mouth daily.     No current facility-administered medications for this visit.    Allergies:   Atorvastatin, Ivp dye [iodinated contrast media], Rosuvastatin, and Shellfish allergy    Social History:  The patient  reports that he has never smoked. He has never used smokeless tobacco. He reports current alcohol use. He reports that he does not use drugs.   Family History:  The patient's family history includes Atrial fibrillation in his mother; Dementia in his father; Stroke in his paternal grandfather.    ROS:   Please see the history of present illness. (+) Chest Burton/tightness (+) Chest congestion All other systems are reviewed and negative.    PHYSICAL EXAM:  VS:  BP (!) 142/78 (BP Location: Right Arm, Patient Position: Sitting, Cuff Size: Normal)    Pulse 64    Ht 5\' 10"  (1.778 m)    Wt 174 lb 12.8 oz (79.3 kg)    BMI 25.08 kg/m  , BMI Body mass index is 25.08 kg/m. GENERAL:  Well appearing HEENT: Pupils equal round and reactive, fundi not visualized, oral mucosa unremarkable NECK:  No jugular venous distention, waveform within normal limits, carotid upstroke brisk and symmetric, no bruits, no thyromegaly LUNGS:  Clear to auscultation bilaterally HEART:  RRR.  PMI not displaced or sustained,S1 and S2 within normal limits, no S3, no S4, no clicks, no rubs, III/IV diastolic murmr ABD:  Flat, positive bowel sounds normal in frequency in pitch, no bruits, no  rebound, no guarding, no midline pulsatile mass, no hepatomegaly, no splenomegaly EXT:  2 plus pulses throughout, no edema, no cyanosis no clubbing SKIN:  No rashes no nodules NEURO:  Cranial nerves II through XII grossly intact, motor grossly intact throughout PSYCH:  Cognitively intact, oriented to person place and time  EKG:   11/25/2021: Sinus rhythm. Rate 64 bpm. 12/01/20: Sinus bradycardia.  Rate 54 bpm.  LVH 09/04/19: Sinus bradycardia.  Rate 57 bpm. 03/21/18: Sinus bradycardia.  Rate 57 bpm. 01/13/17: sinus rhythm.  Rate 60 bpm  Echo 11/18/2020:  1. Bicuspid aortic valve with fusion of the RCC/LCC with raphe (Sievers  type 1). There is moderate aortic regurgitation which appears similar to  prior study. No quantitation performed. The AI is central, and secondary  to the aortic root aneurysm, annular dilation. LV dimensions stable. The aortic valve is bicuspid. Aortic valve regurgitation is moderate. Mild aortic valve sclerosis is present, with no evidence of aortic valve stenosis. Aortic regurgitation PHT measures 866 msec.   2. Stable aortic root measurements compared  to prior. Aortic dilatation  noted. Aneurysm of the aortic root, measuring 46 mm. There is mild  dilatation of the ascending aorta, measuring 41 mm.   3. Left ventricular ejection fraction, by estimation, is 55 to 60%. The  left ventricle has normal function. The left ventricle has no regional  wall motion abnormalities. Left ventricular diastolic parameters are  consistent with Grade I diastolic  dysfunction (impaired relaxation).   4. Right ventricular systolic function is normal. The right ventricular  size is normal. There is normal pulmonary artery systolic Burton. The  estimated right ventricular systolic Burton is 71.2 mmHg.   5. The mitral valve is grossly normal. Mild mitral valve regurgitation.  No evidence of mitral stenosis.   6. The inferior vena cava is normal in size with greater than 50%   respiratory variability, suggesting right atrial Burton of 3 mmHg.   Comparison(s): No significant change from prior study. Aortic root  aneurysm is unchanged ~46 mm. Aortic regurgitation remains moderate.   Echo 03/2018: Study Conclusions   - Left ventricle: The cavity size was normal. Wall thickness was   normal. Systolic function was normal. The estimated ejection   fraction was in the range of 55% to 60%. Wall motion was normal;   there were no regional wall motion abnormalities. Doppler   parameters are consistent with abnormal left ventricular   relaxation (grade 1 diastolic dysfunction). - Aortic valve: Bicuspid valve with fused left and right coronary   cusps. There was no stenosis. There was mild regurgitation. - Aorta: Mildly dilated aortic root and ascending aorta. Aortic   root dimension: 43 mm (ED). Ascending aortic diameter: 41 mm (S). - Right ventricle: The cavity size was normal. Systolic function   was normal. - Tricuspid valve: Peak RV-RA gradient (S): 25 mm Hg. - Pulmonary arteries: PA peak Burton: 28 mm Hg (S). - Inferior vena cava: The vessel was normal in size. The   respirophasic diameter changes were in the normal range (>= 50%),   consistent with normal central venous Burton.   Impressions:   - Normal LV size with EF 55-60%. Normal RV size and systolic   function. Bicuspid aortic valve (fused left and right coronary   cusps) with mild aortic insufficiency.  Echo 01/31/17: Study Conclusions   - Left ventricle: The cavity size was normal. Wall thickness was   normal. Systolic function was normal. The estimated ejection   fraction was in the range of 55% to 60%. Wall motion was normal;   there were no regional wall motion abnormalities. Doppler   parameters are consistent with abnormal left ventricular   relaxation (grade 1 diastolic dysfunction). - Aortic valve: Possible bicuspid valve with apparent fusion of the   right and left coronary cusps.  There was no stenosis. There was   mild to moderate regurgitation. - Aorta: Mildly dilated aortic root. Aortic root dimension: 43 mm   (ED). Ascending aortic diameter: 39 mm (S). - Mitral valve: There was trivial regurgitation. - Right ventricle: The cavity size was normal. Systolic function   was normal. - Tricuspid valve: Peak RV-RA gradient (S): 24 mm Hg. - Pulmonary arteries: PA peak Burton: 27 mm Hg (S). - Inferior vena cava: The vessel was normal in size. The   respirophasic diameter changes were in the normal range (>= 50%),   consistent with normal central venous Burton.  ETT 10/21/16: Blood Burton demonstrated a normal response to exercise. There was no ST segment deviation noted during stress. Clinically and electrically  negative for ischemia Exclellent exercise capacity  48 hour Holter 09/14/16:   Quality: Fair.  Baseline artifact. Predominant rhythm: sinus rhythm, sinus bradycardia Average heart rate: 70 bpm Max heart rate: 127 bpm Min heart rate: 40 bpm One pause 1.9 sec <1% PVCs Short runs of atrial tachycardia  Recent Labs: 02/17/2021: ALT 22; BUN 17; Creatinine, Ser 0.83; Potassium 4.6; Sodium 138   06/14/2019: Total cholesterol 221, triglycerides 112, HDL 44, LDL 155 Hemoglobin A1c 5.4% Potassium 4.4, creatinine 0.8  03/19/2015: Cholesterol 185, triglycerides 96, HDL 42, LDL 123   Lipid Panel    Component Value Date/Time   CHOL 153 02/17/2021 0807   TRIG 59 02/17/2021 0807   HDL 49 02/17/2021 0807   CHOLHDL 3.1 02/17/2021 0807   LDLCALC 92 02/17/2021 0807    Wt Readings from Last 3 Encounters:  11/25/21 174 lb 12.8 oz (79.3 kg)  12/01/20 179 lb 12.8 oz (81.6 kg)  12/04/19 173 lb 3.2 oz (78.6 kg)     ASSESSMENT AND PLAN:  Aneurysm of ascending aorta (HCC) CT pending for next week.  BP poorly controlled.  Adding ARB as listed.  Bicuspid aortic valve He is asymptomatic.  Serial echo ordered for next week.   Moderate aortic  regurgitation He is euvolemic.  Echo next week.   Pure hypercholesterolemia He didn't tolerate rosuvastatin and atorvastatin dose was reduced due to rash.  He will come back for fasting lipids and a CMP.  Chest tightness Lately Mr. Main has been experiencing chest tightness.  He is unsure whether it is worse with exertion.  He is already getting a chest CT-A of the aorta next week.  We will get a coronary CTA at that time to evaluate for ischemia.  I suspect it is related to either his poorly controlled hypertension or his recent upper respiratory infection, however given his risk factors will need to rule out ischemic heart disease.  Cold intolerance Check TSH.  Essential hypertension Blood Burton is poorly controlled both here and at home.  I suspect is related to his recent lifestyle changes.  He is not exercising as much and he is undergoing a kitchen renovation so he is not cooking and eating out more.  Given his ascending aortic aneurysm, we need to be aggressive about his blood Burton.  Start valsartan 80 mg daily.  Check a CMP in a week.  We discussed the fact that hopefully this will be short-term if his blood Burton comes down once he gets back to his usual lifestyle.  He will check his pressures and bring to follow-up next month.   Current medicines are reviewed at length with the patient today.  The patient does not have concerns regarding medicines.  The following changes have been made:  no change  Labs/ tests ordered today include:   No orders of the defined types were placed in this encounter.   Disposition:   FU with APP in 1 month. FU with Demiyah Fischbach C. Oval Linsey, MD, [facility] in 1 year.  I,Mathew Stumpf,acting as a Education administrator for Skeet Latch, MD.,have documented all relevant documentation on the behalf of Skeet Latch, MD,as directed by  Skeet Latch, MD while in the presence of Skeet Latch, MD.  I, [place] Oval Linsey, MD have reviewed all  documentation for this visit.  The documentation of the exam, diagnosis, procedures, and orders on 11/25/2021 are all accurate and complete.   Signed, Sloane Palmer C. Oval Linsey, MD, [facility]  11/25/2021 9:22 AM    [place]

## 2021-11-25 NOTE — Assessment & Plan Note (Signed)
CT pending for next week.  BP poorly controlled.  Adding ARB as listed.

## 2021-11-25 NOTE — Progress Notes (Signed)
Phone call to patient to review instructions for 13 hr prep for CT w/ contrast on 12/02/21 at 1140 AM. Prescription called into [facility]. Pt aware and verbalized understanding of instructions. Prescription: 12/01/21 @ 1040 PM- 50mg  Prednisone 12/02/21 @ 440 AM- 50mg  Prednisone 12/02/21 @ 1040 AM - 50mg  Prednisone and 50mg  Benadryl   Benadryl was also called in as a prescription, per the patients request. Advised pt not to drive the day of taking this medication as it may cause drowsiness. Pt verbalized understanding.

## 2021-11-25 NOTE — Assessment & Plan Note (Signed)
He is asymptomatic.  Serial echo ordered for next week.

## 2021-11-30 ENCOUNTER — Telehealth (HOSPITAL_COMMUNITY): Payer: Self-pay | Admitting: *Deleted

## 2021-11-30 NOTE — Telephone Encounter (Signed)
Reaching out to patient to offer assistance regarding upcoming cardiac imaging study; pt verbalizes understanding of appt date/time, parking situation and where to check in, pre-test NPO status and medications ordered, and verified current allergies; name and call back number provided for further questions should they arise  Gordy Clement RN Navigator [facility] [phone] office [phone] cell  Patient verbalized understanding of how to take 13 hour prep and metoprolol tartrate. He has a lab appointment tomorrow. He is aware to arrive at 9am for his 9:30am cardiac CT scan.

## 2021-12-01 ENCOUNTER — Other Ambulatory Visit: Payer: PPO | Admitting: *Deleted

## 2021-12-01 ENCOUNTER — Other Ambulatory Visit: Payer: Self-pay

## 2021-12-01 ENCOUNTER — Ambulatory Visit (HOSPITAL_BASED_OUTPATIENT_CLINIC_OR_DEPARTMENT_OTHER): Payer: PPO

## 2021-12-01 DIAGNOSIS — I7121 Aneurysm of the ascending aorta, without rupture: Secondary | ICD-10-CM | POA: Diagnosis not present

## 2021-12-01 DIAGNOSIS — E7800 Pure hypercholesterolemia, unspecified: Secondary | ICD-10-CM | POA: Diagnosis not present

## 2021-12-01 DIAGNOSIS — I10 Essential (primary) hypertension: Secondary | ICD-10-CM

## 2021-12-01 DIAGNOSIS — R0789 Other chest pain: Secondary | ICD-10-CM | POA: Diagnosis not present

## 2021-12-01 DIAGNOSIS — Q231 Congenital insufficiency of aortic valve: Secondary | ICD-10-CM | POA: Diagnosis not present

## 2021-12-01 DIAGNOSIS — I712 Thoracic aortic aneurysm, without rupture, unspecified: Secondary | ICD-10-CM | POA: Diagnosis not present

## 2021-12-01 LAB — COMPREHENSIVE METABOLIC PANEL
ALT: 19 IU/L (ref 0–44)
AST: 18 IU/L (ref 0–40)
Albumin/Globulin Ratio: 2 (ref 1.2–2.2)
Albumin: 4.7 g/dL (ref 3.8–4.8)
Alkaline Phosphatase: 111 IU/L (ref 44–121)
BUN/Creatinine Ratio: 17 (ref 10–24)
BUN: 14 mg/dL (ref 8–27)
Bilirubin Total: 0.5 mg/dL (ref 0.0–1.2)
CO2: 27 mmol/L (ref 20–29)
Calcium: 9.5 mg/dL (ref 8.6–10.2)
Chloride: 102 mmol/L (ref 96–106)
Creatinine, Ser: 0.82 mg/dL (ref 0.76–1.27)
Globulin, Total: 2.4 g/dL (ref 1.5–4.5)
Glucose: 97 mg/dL (ref 70–99)
Potassium: 4.4 mmol/L (ref 3.5–5.2)
Sodium: 139 mmol/L (ref 134–144)
Total Protein: 7.1 g/dL (ref 6.0–8.5)
eGFR: 96 mL/min/{1.73_m2} (ref >59)

## 2021-12-01 LAB — LIPID PANEL
Chol/HDL Ratio: 3.8 ratio (ref 0.0–5.0)
Cholesterol, Total: 181 mg/dL (ref 100–199)
HDL: 48 mg/dL (ref >39)
LDL Chol Calc (NIH): 120 mg/dL — ABNORMAL HIGH (ref 0–99)
Triglycerides: 71 mg/dL (ref 0–149)
VLDL Cholesterol Cal: 13 mg/dL (ref 5–40)

## 2021-12-01 LAB — ECHOCARDIOGRAM COMPLETE
AR max vel: 2.6 cm2
AV Area VTI: 2.71 cm2
AV Area mean vel: 2.63 cm2
AV Mean grad: 6 mmHg
AV Peak grad: 11 mmHg
Ao pk vel: 1.66 m/s
Area-P 1/2: 1.75 cm2
P 1/2 time: 671 msec
S' Lateral: 3.2 cm

## 2021-12-01 LAB — TSH: TSH: 2.04 u[IU]/mL (ref 0.450–4.500)

## 2021-12-02 ENCOUNTER — Ambulatory Visit (HOSPITAL_COMMUNITY)
Admission: RE | Admit: 2021-12-02 | Discharge: 2021-12-02 | Disposition: A | Discharge status: Home / Self Care | Payer: PPO | Source: Ambulatory Visit | Attending: Cardiovascular Disease | Admitting: Cardiovascular Disease

## 2021-12-02 ENCOUNTER — Inpatient Hospital Stay: Admission: RE | Admit: 2021-12-02 | Payer: PPO | Source: Ambulatory Visit

## 2021-12-02 DIAGNOSIS — E7800 Pure hypercholesterolemia, unspecified: Secondary | ICD-10-CM | POA: Diagnosis not present

## 2021-12-02 DIAGNOSIS — I10 Essential (primary) hypertension: Secondary | ICD-10-CM | POA: Insufficient documentation

## 2021-12-02 DIAGNOSIS — J9811 Atelectasis: Secondary | ICD-10-CM | POA: Diagnosis not present

## 2021-12-02 DIAGNOSIS — I7121 Aneurysm of the ascending aorta, without rupture: Secondary | ICD-10-CM | POA: Insufficient documentation

## 2021-12-02 DIAGNOSIS — I2510 Atherosclerotic heart disease of native coronary artery without angina pectoris: Secondary | ICD-10-CM | POA: Diagnosis not present

## 2021-12-02 DIAGNOSIS — R0789 Other chest pain: Secondary | ICD-10-CM | POA: Insufficient documentation

## 2021-12-02 DIAGNOSIS — I712 Thoracic aortic aneurysm, without rupture, unspecified: Secondary | ICD-10-CM | POA: Insufficient documentation

## 2021-12-02 DIAGNOSIS — Q231 Congenital insufficiency of aortic valve: Secondary | ICD-10-CM | POA: Insufficient documentation

## 2021-12-02 MED ORDER — NITROGLYCERIN 0.4 MG SL SUBL: 0.8000 mg | SUBLINGUAL_TABLET | Freq: Once | SUBLINGUAL | Status: AC

## 2021-12-02 MED ORDER — NITROGLYCERIN 0.4 MG SL SUBL
SUBLINGUAL_TABLET | SUBLINGUAL | Status: AC
  Administered 2021-12-02: 09:00:00 0.8 mg via SUBLINGUAL
  Filled 2021-12-02: qty 2

## 2021-12-02 MED ORDER — IOHEXOL 350 MG/ML SOLN
100.0000 mL | Freq: Once | INTRAVENOUS | Status: AC
  Administered 2021-12-02: 10:00:00 95 mL via INTRAVENOUS

## 2021-12-22 NOTE — Progress Notes (Signed)
Cardiology Office Note:    Date:  12/24/2021   ID:  Ryan Burton, DOB Feb 13, 1956, MRN [id number]  PCP:  Lavone Orn, MD   [facility] Providers Cardiologist:  Skeet Latch, MD     Referring MD: Lavone Orn, MD   Chief Complaint: 4 week f/u HTN, CAD, hyperlipidemia  History of Present Illness:    Ryan Burton is a 66 y.o. male with a hx of mild nonobstructive CAD on recent CT, moderate aortic regurgitation, bicuspid aortic valve, ascending aortic dilatation, PVC, HTN, and Crohn's disease.   He initially established care with our in 2017 with symptoms of dizziness and chest pain. He was noted to have murmur on exam. Echo revealed LVEF 60-65% with G1DD, functionally bicuspid aortic valve with moderate aortic regurgitation. Mild to moderate dilatation of aortic root measuring 4.4 cm was also noted. Repeat echos 01/2017 and 03/2018 were essentially unchanged. Echo 10/2019 revealed moderate AR and the ascending aorta was 4.0 cm. He wore a 48 hour monitor that showed occasional PVCs but was otherwise unremarkable. Had ETT 10/2016 that was negative for ischemia. Previously intolerant of rosuvastatin and atorvastatin due to rash.   He was last seen in our office by Dr. Oval Linsey on 11/25/21 at which time his BP was poorly controlled and valsartan was added to his regimen. Echo 12/01/21 revealed LVEF 60-65%, no rwma, no LVH, G1DD, milldy enlarged RV, mild MR, mild TR, moderate AR, mild PR. Coronary CT was ordered for chest pain 12/02/21 which revealed calcium score 20.2, 30th percentile for age and sex matched control, mild nonobstructive coronary artery disease.  CTA revealed aortic atherosclerosis with ectasia of ascending thoracic aorta 4.0 cm in diameter. Recheck of kidney function following initiation of valsartan was stable.  He was advised to follow-up in 4 weeks.  Today, he is here alone for follow-up.  He reports he has not been monitoring BP at home as consistently as he  should have but feels the majority of readings are around high 270W to 237 systolic.  He continues to have limited access to his kitchen and is eating out more.  Admits he has not resumed regular exercise routine.   Elevated cholesterol - intolerant to statins LDL 120  Past Medical History:  Diagnosis Date   Aneurysm of ascending aorta 10/25/2016   4.4cm by echo 10/21/16   Atrial tachycardia ([place]) 01/15/2017   Bicuspid aortic valve 10/25/2016   Chest pain    Chest tightness 11/25/2021   Cold intolerance 11/25/2021   Crohn's disease ([place])    Dizziness    Essential hypertension 09/04/2019   Moderate aortic regurgitation 10/25/2016   Palpitations 09/21/2016   Pure hypercholesterolemia 09/04/2019   PVC (premature ventricular contraction) 01/15/2017   Shortness of breath 01/15/2017    Past Surgical History:  Procedure Laterality Date   KIDNEY STONE SURGERY     PTERYGIUM EXCISION     SHOULDER SURGERY      Current Medications: Current Meds  Medication Sig   atorvastatin (LIPITOR) 10 MG tablet Take 0.5 tablets (5 mg total) by mouth daily.   cyanocobalamin 1000 MCG tablet Take 1,000 mcg by mouth daily.   diphenhydrAMINE (BENADRYL) 50 MG tablet Take 1 tablet (43m) 1 hr prior to CT appt   FLUZONE HIGH-DOSE QUADRIVALENT 0.7 ML SUSY    metoprolol tartrate (LOPRESSOR) 25 MG tablet Take 1 tablet 2 hours prior to CT   Multiple Vitamins-Minerals (MULTIVITAMIN WITH MINERALS) tablet Take 1 tablet by mouth daily.   PFIZER COVID-19 VAC BIVALENT  injection    predniSONE (DELTASONE) 50 MG tablet Take 1 tablet (71m) 13 hr prior to CT scan, take 1 tablet (527m 7 hr prior to CT scan, take 1 tablet (5060m1 hr prior to CT scan   valsartan (DIOVAN) 80 MG tablet Take 1 tablet (80 mg total) by mouth daily.   [DISCONTINUED] atorvastatin (LIPITOR) 10 MG tablet TAKE 1/2 TABLET BY MOUTH Monday, Wednesday, AND Thursday ONLY (Patient taking differently: TAKE 1/2 TABLET BY MOUTH Monday, Wednesday, AND Friday ONLY)      Allergies:   Atorvastatin, Ivp dye [iodinated contrast media], Rosuvastatin, and Shellfish allergy   Social History   Socioeconomic History   Marital status: Married    Spouse name: Not on file   Number of children: Not on file   Years of education: Not on file   Highest education level: Not on file  Occupational History   Not on file  Tobacco Use   Smoking status: Never   Smokeless tobacco: Never  Substance and Sexual Activity   Alcohol use: Yes    Comment: 12 DRINKS PER WEEK, COMBINED BEER, WINE, LIQUOR    Drug use: No   Sexual activity: Not on file  Other Topics Concern   Not on file  Social History Narrative   Not on file   Social Determinants of Health   Financial Resource Strain: Not on file  Food Insecurity: Not on file  Transportation Needs: Not on file  Physical Activity: Not on file  Stress: Not on file  Social Connections: Not on file     Family History: The patient's family history includes Atrial fibrillation in his mother; Dementia in his father; Stroke in his paternal grandfather.  ROS:   Please see the history of present illness. All other systems reviewed and are negative.  Labs/Other Studies Reviewed:    The following studies were reviewed today:  CTA Chest Aorta 12/02/21  IMPRESSION: 1. Aortic atherosclerosis with ectasia of ascending thoracic aorta (4.0 cm in diameter). Recommend annual imaging followup by CTA or MRA. This recommendation follows 2010 ACCF/AHA/AATS/ACR/ASA/SCA/SCAI/SIR/STS/SVM Guidelines for the Diagnosis and Management of Patients with Thoracic Aortic Disease. Circulation. 2010; 121: E: [id number] aneurysm NOS (ICD10-I71.9). 2. There is also left anterior descending coronary artery disease. 3. There are calcifications of the aortic valve. Echocardiographic correlation for evaluation of potential valvular dysfunction may be warranted if clinically indicated.   Coronary CT 12/02/21  1. Coronary calcium score of  20.2. This was 30 45rcentile for age and sex matched control.   2. Normal coronary origin with right dominance.   3. Mild Coronary artery disease. CAD-RADS 2. Mild non-obstructive CAD (25-49%). Consider non-atherosclerotic causes of chest pain. Consider preventive therapy and risk factor modification.   4. Proximal ascending aorta aneurysm (40.1 mm).  Echo 12/01/21   Left Ventricle: Left ventricular ejection fraction, by estimation, is 60  to 65%. The left ventricle has normal function. The left ventricle has no  regional wall motion abnormalities. The left ventricular internal cavity  size was normal in size. There is  no left ventricular hypertrophy. Left ventricular diastolic parameters  are consistent with Grade I diastolic dysfunction (impaired relaxation).  Right Ventricle: The right ventricular size is mildly enlarged. No  increase in right ventricular wall thickness. Right ventricular systolic  function is normal.  Left Atrium: Left atrial size was normal in size.  Right Atrium: Right atrial size was normal in size.  Pericardium: There is no evidence of pericardial effusion.  Mitral Valve: The mitral valve  is normal in structure. Mild mitral valve  regurgitation. No evidence of mitral valve stenosis.  Tricuspid Valve: The tricuspid valve is normal in structure. Tricuspid  valve regurgitation is mild . No evidence of tricuspid stenosis.  Aortic Valve: Partial fusion of RCC and LCC. The aortic valve is bicuspid.  There is mild calcification of the aortic valve. There is mild thickening  of the aortic valve. Aortic valve regurgitation is moderate. Aortic  regurgitation PHT measures 671 msec.  Aortic valve sclerosis/calcification is present, without any evidence of  aortic stenosis. Aortic valve mean gradient measures 6.0 mmHg. Aortic  valve peak gradient measures 11.0 mmHg. Aortic valve area, by VTI measures  2.71 cm.  Pulmonic Valve: The pulmonic valve was normal in  structure. Pulmonic valve  regurgitation is mild. No evidence of pulmonic stenosis.  Aorta: The aortic root is normal in size and structure. There is moderate  dilatation of the aortic root, measuring 46 mm. There is mild dilatation  of the ascending aorta, measuring 43 mm.  Venous: The inferior vena cava is normal in size with greater than 50%  respiratory variability, suggesting right atrial pressure of 3 mmHg.  IAS/Shunts: No atrial level shunt detected by color flow Doppler.    Recent Labs: 12/01/2021: ALT 19; BUN 14; Creatinine, Ser 0.82; Potassium 4.4; Sodium 139; TSH 2.040  Recent Lipid Panel    Component Value Date/Time   CHOL 181 12/01/2021 0907   TRIG 71 12/01/2021 0907   HDL 48 12/01/2021 0907   CHOLHDL 3.8 12/01/2021 0907   LDLCALC 120 (H) 12/01/2021 0907     Risk Assessment/Calculations:      Physical Exam:    VS:  BP 136/82    Pulse 60    Ht _0  (1.778 m)    Wt 172 lb 4.8 oz (78.2 kg)    BMI 24.72 kg/m     Wt Readings from Last 3 Encounters:  12/24/21 172 lb 4.8 oz (78.2 kg)  11/25/21 174 lb 12.8 oz (79.3 kg)  12/01/20 179 lb 12.8 oz (81.6 kg)     GEN:  Well nourished, well developed in no acute distress HEENT: Normal NECK: No JVD; No carotid bruits CARDIAC: RRR, 3/6 diastolic murmur LUSB, rubs, gallops RESPIRATORY:  Clear to auscultation without rales, wheezing or rhonchi  ABDOMEN: Soft, non-tender, non-distended MUSCULOSKELETAL:  No edema; No deformity. 2+ pedal pulses, equal bilaterally SKIN: Warm and dry NEUROLOGIC:  Alert and oriented x 3 PSYCHIATRIC:  Normal affect   EKG:  EKG is not ordered today.    Diagnoses:    1. Hyperlipidemia LDL goal <70   2. Aneurysm of ascending aorta without rupture   3. Bicuspid aortic valve   4. Essential hypertension   5. Moderate aortic regurgitation    Assessment and Plan:     Bicuspid aortic valve/Moderate aortic regurgitation: Stable by echo 12/01/21. No symptoms of chest pain, dyspnea,  palpitations,  activity intolerance, pre-syncope, or syncope. Continue optimum blood pressure control. Will monitor with annual echo, sooner if symptoms warrant.   Thoracic aortic aneurysm: Stable at 4.0 cm by CTA 12/02/21. Emphasized the importance of good BP control due to aneurysm. Will follow annually with echo. If echo with concerns for changing size, repeat CT. See lipid management below.   Hyperlipidemia LDL goal < 70: LDL 120 on 12/01/21.  Discussed goal of less than 70 due to nonobstructive coronary artery disease by CT. He is on very low-dose Lipitor 5 mg 3 days per week (previously reported intolerance due  to rash). Tolerating well without side effect. Is hesitant to start an additional agent such as PCSK9 inhibitor or bempedoic acid. He prefers to increase Lipitor to 5 mg daily and recheck lipid in 2 months. Discussed that he will likely need additional agent to control. Would favor referral to [facility] Clinic after next lab appointment if LDL not at goal.    Essential hypertension: BP mildly elevated today even after resting and recheck by me. Reports similar readings at home.  He is hesitant to increase valsartan or add additional agent at this time.  He would like to monitor BP more closely at home and improve diet and exercise. We agreed upon submission of BP readings in about 3 weeks for review. Would favor addition of thiazide diuretic if BP remains consistently > 130/80. Encouraged moderate intensity exercise to achieve 150 minutes per week, and heart healthy mostly plant-based, lean protein diet. Information on mediterranean diet given.   Disposition: TBD based upon lipid results, may need APP f/u in 3-6 months, 1 year with Dr. Oval Linsey   Medication Adjustments/Labs and Tests Ordered: Current medicines are reviewed at length with the patient today.  Concerns regarding medicines are outlined above.  Orders Placed This Encounter  Procedures   Lipid Profile   Comp Met (CMET)   Meds ordered this  encounter  Medications   atorvastatin (LIPITOR) 10 MG tablet    Sig: Take 0.5 tablets (5 mg total) by mouth daily.    Dispense:  45 tablet    Refill:  3    Order Specific Question:   Supervising Provider    Answer:   Thayer Headings [phone]    Patient Instructions  Medication Instructions:  Your physician has recommended you make the following change in your medication:  INCREASE Lipitor to 5 mg daily  *If you need a refill on your cardiac medications before your next appointment, please call your pharmacy*   Lab Work: Your physician recommends that you return for lab work on or about the first week of April NO appointment is required. Lab is on the 3rd floor at [facility] - avoid lunchtime (approximately 12-1 pm)  You will need to FAST for this appointment - nothing to eat or drink after midnight the night before except water.   Testing/Procedures: HOW TO TAKE YOUR BLOOD PRESSURE: Rest 5 minutes before taking your blood pressure.  Dont smoke or drink caffeinated beverages for at least 30 minutes before. Take your blood pressure before (not after) you eat. Sit comfortably with your back supported and both feet on the floor (dont cross your legs). Elevate your arm to heart level on a table or a desk. Use the proper sized cuff. It should fit smoothly and snugly around your bare upper arm. There should be enough room to slip a fingertip under the cuff. The bottom edge of the cuff should be 1 inch above the crease of the elbow.  Send [place] a message to report BP readings in 3-4 weeks    Follow-Up: At [facility], you and your health needs are our priority.  As part of our continuing mission to provide you with exceptional heart care, we have created designated Provider Care Teams.  These Care Teams include your primary Cardiologist (physician) and Advanced Practice Providers (APPs -  Physician Assistants and Nurse Practitioners) who all work together to provide you with the  care you need, when you need it.  Your next appointment:   1 year(s)  The format for your  next appointment:   In Person  Provider:   Skeet Latch, MD    Other Instructions  Mediterranean Diet A Mediterranean diet refers to food and lifestyle choices that are based on the traditions of countries located on the [place]. It focuses on eating more fruits, vegetables, whole grains, beans, nuts, seeds, and heart-healthy fats, and eating less dairy, meat, eggs, and processed foods with added sugar, salt, and fat. This way of eating has been shown to help prevent certain conditions and improve outcomes for people who have chronic diseases, like kidney disease and heart disease. What are tips for following this plan? Reading food labels Check the serving size of packaged foods. For foods such as rice and pasta, the serving size refers to the amount of cooked product, not dry. Check the total fat in packaged foods. Avoid foods that have saturated fat or trans fats. Check the ingredient list for added sugars, such as corn syrup. Shopping  Buy a variety of foods that offer a balanced diet, including: Fresh fruits and vegetables (produce). Grains, beans, nuts, and seeds. Some of these may be available in unpackaged forms or large amounts (in bulk). Fresh seafood. Poultry and eggs. Low-fat dairy products. Buy whole ingredients instead of prepackaged foods. Buy fresh fruits and vegetables in-season from local farmers markets. Buy plain frozen fruits and vegetables. If you do not have access to quality fresh seafood, buy precooked frozen shrimp or canned fish, such as tuna, salmon, or sardines. Stock your pantry so you always have certain foods on hand, such as olive oil, canned tuna, canned tomatoes, rice, pasta, and beans. Cooking Cook foods with extra-virgin olive oil instead of using butter or other vegetable oils. Have meat as a side dish, and have vegetables or grains as your  main dish. This means having meat in small portions or adding small amounts of meat to foods like pasta or stew. Use beans or vegetables instead of meat in common dishes like chili or lasagna. Experiment with different cooking methods. Try roasting, broiling, steaming, and sauting vegetables. Add frozen vegetables to soups, stews, pasta, or rice. Add nuts or seeds for added healthy fats and plant protein at each meal. You can add these to yogurt, salads, or vegetable dishes. Marinate fish or vegetables using olive oil, lemon juice, garlic, and fresh herbs. Meal planning Plan to eat one vegetarian meal one day each week. Try to work up to two vegetarian meals, if possible. Eat seafood two or more times a week. Have healthy snacks readily available, such as: Vegetable sticks with hummus. Greek yogurt. Fruit and nut trail mix. Eat balanced meals throughout the week. This includes: Fruit: 2-3 servings a day. Vegetables: 4-5 servings a day. Low-fat dairy: 2 servings a day. Fish, poultry, or lean meat: 1 serving a day. Beans and legumes: 2 or more servings a week. Nuts and seeds: 1-2 servings a day. Whole grains: 6-8 servings a day. Extra-virgin olive oil: 3-4 servings a day. Limit red meat and sweets to only a few servings a month. Lifestyle  Cook and eat meals together with your family, when possible. Drink enough fluid to keep your urine pale yellow. Be physically active every day. This includes: Aerobic exercise like running or swimming. Leisure activities like gardening, walking, or housework. Get 7-8 hours of sleep each night. If recommended by your health care provider, drink red wine in moderation. This means 1 glass a day for nonpregnant women and 2 glasses a day for men. A glass of  wine equals 5 oz (150 mL). What foods should I eat? Fruits Apples. Apricots. Avocado. Berries. Bananas. Cherries. Dates. Figs. Grapes. Lemons. Melon. Oranges. Peaches. Plums.  Pomegranate. Vegetables Artichokes. Beets. Broccoli. Cabbage. Carrots. Eggplant. Green beans. Chard. Kale. Spinach. Onions. Leeks. Peas. Squash. Tomatoes. Peppers. Radishes. Grains Whole-grain pasta. Brown rice. Bulgur wheat. Polenta. Couscous. Whole-wheat bread. [place]. Meats and other proteins Beans. Almonds. Sunflower seeds. Pine nuts. Peanuts. [place]. Salmon. Scallops. Shrimp. Wacissa. Tilapia. Clams. Oysters. Eggs. Poultry without skin. Dairy Low-fat milk. Cheese. Greek yogurt. Fats and oils Extra-virgin olive oil. Avocado oil. Grapeseed oil. Beverages Water. Red wine. Herbal tea. Sweets and desserts Greek yogurt with honey. Baked apples. Poached pears. Trail mix. Seasonings and condiments Basil. Cilantro. Coriander. Cumin. Mint. Parsley. Sage. Rosemary. Tarragon. Garlic. Oregano. Thyme. Pepper. Balsamic vinegar. Tahini. Hummus. Tomato sauce. Olives. Mushrooms. The items listed above may not be a complete list of foods and beverages you can eat. Contact a dietitian for more information. What foods should I limit? This is a list of foods that should be eaten rarely or only on special occasions. Fruits Fruit canned in syrup. Vegetables Deep-fried potatoes (french fries). Grains Prepackaged pasta or rice dishes. Prepackaged cereal with added sugar. Prepackaged snacks with added sugar. Meats and other proteins Beef. Pork. Lamb. Poultry with skin. Hot dogs. Berniece Salines. Dairy Ice cream. Sour cream. Whole milk. Fats and oils Butter. Canola oil. Vegetable oil. Beef fat (tallow). Lard. Beverages Juice. Sugar-sweetened soft drinks. Beer. Liquor and spirits. Sweets and desserts Cookies. Cakes. Pies. Candy. Seasonings and condiments Mayonnaise. Pre-made sauces and marinades. The items listed above may not be a complete list of foods and beverages you should limit. Contact a dietitian for more information. Summary The Mediterranean diet includes both food and lifestyle choices. Eat a  variety of fresh fruits and vegetables, beans, nuts, seeds, and whole grains. Limit the amount of red meat and sweets that you eat. If recommended by your health care provider, drink red wine in moderation. This means 1 glass a day for nonpregnant women and 2 glasses a day for men. A glass of wine equals 5 oz (150 mL). This information is not intended to replace advice given to you by your health care provider. Make sure you discuss any questions you have with your health care provider. Document Revised: 11/30/2019 Document Reviewed: 09/27/2019 Elsevier Patient Education  2022 Lenora, Emmaline Life, NP  12/24/2021 9:07 AM    [place]

## 2021-12-24 ENCOUNTER — Other Ambulatory Visit: Payer: Self-pay

## 2021-12-24 ENCOUNTER — Ambulatory Visit (HOSPITAL_BASED_OUTPATIENT_CLINIC_OR_DEPARTMENT_OTHER): Payer: PPO | Admitting: Nurse Practitioner

## 2021-12-24 ENCOUNTER — Encounter (HOSPITAL_BASED_OUTPATIENT_CLINIC_OR_DEPARTMENT_OTHER): Payer: Self-pay | Admitting: Nurse Practitioner

## 2021-12-24 VITALS — BP 136/82 | HR 60 | Ht 70.0 in | Wt 172.3 lb

## 2021-12-24 DIAGNOSIS — I10 Essential (primary) hypertension: Secondary | ICD-10-CM

## 2021-12-24 DIAGNOSIS — Q231 Congenital insufficiency of aortic valve: Secondary | ICD-10-CM | POA: Diagnosis not present

## 2021-12-24 DIAGNOSIS — I7121 Aneurysm of the ascending aorta, without rupture: Secondary | ICD-10-CM

## 2021-12-24 DIAGNOSIS — I351 Nonrheumatic aortic (valve) insufficiency: Secondary | ICD-10-CM | POA: Diagnosis not present

## 2021-12-24 DIAGNOSIS — E785 Hyperlipidemia, unspecified: Secondary | ICD-10-CM

## 2021-12-24 MED ORDER — ATORVASTATIN CALCIUM 10 MG PO TABS
5.0000 mg | ORAL_TABLET | Freq: Every day | ORAL | 3 refills | Status: DC
Start: 2021-12-24 — End: 2022-01-28

## 2021-12-24 NOTE — Patient Instructions (Addendum)
Medication Instructions:  Your physician has recommended you make the following change in your medication:  INCREASE Lipitor to 5 mg daily  *If you need a refill on your cardiac medications before your next appointment, please call your pharmacy*   Lab Work: Your physician recommends that you return for lab work on or about the first week of April NO appointment is required. Lab is on the 3rd floor at [facility] - avoid lunchtime (approximately 12-1 pm)  You will need to FAST for this appointment - nothing to eat or drink after midnight the night before except water.   Testing/Procedures: HOW TO TAKE YOUR BLOOD PRESSURE: Rest 5 minutes before taking your blood pressure.  Dont smoke or drink caffeinated beverages for at least 30 minutes before. Take your blood pressure before (not after) you eat. Sit comfortably with your back supported and both feet on the floor (dont cross your legs). Elevate your arm to heart level on a table or a desk. Use the proper sized cuff. It should fit smoothly and snugly around your bare upper arm. There should be enough room to slip a fingertip under the cuff. The bottom edge of the cuff should be 1 inch above the crease of the elbow.  Send [place] a message to report BP readings in 3-4 weeks    Follow-Up: At [facility], you and your health needs are our priority.  As part of our continuing mission to provide you with exceptional heart care, we have created designated Provider Care Teams.  These Care Teams include your primary Cardiologist (physician) and Advanced Practice Providers (APPs -  Physician Assistants and Nurse Practitioners) who all work together to provide you with the care you need, when you need it.  Your next appointment:   1 year(s)  The format for your next appointment:   In Person  Provider:   Skeet Latch, MD    Other Instructions  Mediterranean Diet A Mediterranean diet refers to food and lifestyle choices that are  based on the traditions of countries located on the [place]. It focuses on eating more fruits, vegetables, whole grains, beans, nuts, seeds, and heart-healthy fats, and eating less dairy, meat, eggs, and processed foods with added sugar, salt, and fat. This way of eating has been shown to help prevent certain conditions and improve outcomes for people who have chronic diseases, like kidney disease and heart disease. What are tips for following this plan? Reading food labels Check the serving size of packaged foods. For foods such as rice and pasta, the serving size refers to the amount of cooked product, not dry. Check the total fat in packaged foods. Avoid foods that have saturated fat or trans fats. Check the ingredient list for added sugars, such as corn syrup. Shopping  Buy a variety of foods that offer a balanced diet, including: Fresh fruits and vegetables (produce). Grains, beans, nuts, and seeds. Some of these may be available in unpackaged forms or large amounts (in bulk). Fresh seafood. Poultry and eggs. Low-fat dairy products. Buy whole ingredients instead of prepackaged foods. Buy fresh fruits and vegetables in-season from local farmers markets. Buy plain frozen fruits and vegetables. If you do not have access to quality fresh seafood, buy precooked frozen shrimp or canned fish, such as tuna, salmon, or sardines. Stock your pantry so you always have certain foods on hand, such as olive oil, canned tuna, canned tomatoes, rice, pasta, and beans. Cooking Cook foods with extra-virgin olive oil instead of using butter  or other vegetable oils. Have meat as a side dish, and have vegetables or grains as your main dish. This means having meat in small portions or adding small amounts of meat to foods like pasta or stew. Use beans or vegetables instead of meat in common dishes like chili or lasagna. Experiment with different cooking methods. Try roasting, broiling, steaming, and  sauting vegetables. Add frozen vegetables to soups, stews, pasta, or rice. Add nuts or seeds for added healthy fats and plant protein at each meal. You can add these to yogurt, salads, or vegetable dishes. Marinate fish or vegetables using olive oil, lemon juice, garlic, and fresh herbs. Meal planning Plan to eat one vegetarian meal one day each week. Try to work up to two vegetarian meals, if possible. Eat seafood two or more times a week. Have healthy snacks readily available, such as: Vegetable sticks with hummus. Greek yogurt. Fruit and nut trail mix. Eat balanced meals throughout the week. This includes: Fruit: 2-3 servings a day. Vegetables: 4-5 servings a day. Low-fat dairy: 2 servings a day. Fish, poultry, or lean meat: 1 serving a day. Beans and legumes: 2 or more servings a week. Nuts and seeds: 1-2 servings a day. Whole grains: 6-8 servings a day. Extra-virgin olive oil: 3-4 servings a day. Limit red meat and sweets to only a few servings a month. Lifestyle  Cook and eat meals together with your family, when possible. Drink enough fluid to keep your urine pale yellow. Be physically active every day. This includes: Aerobic exercise like running or swimming. Leisure activities like gardening, walking, or housework. Get 7-8 hours of sleep each night. If recommended by your health care provider, drink red wine in moderation. This means 1 glass a day for nonpregnant women and 2 glasses a day for men. A glass of wine equals 5 oz (150 mL). What foods should I eat? Fruits Apples. Apricots. Avocado. Berries. Bananas. Cherries. Dates. Figs. Grapes. Lemons. Melon. Oranges. Peaches. Plums. Pomegranate. Vegetables Artichokes. Beets. Broccoli. Cabbage. Carrots. Eggplant. Green beans. Chard. Kale. Spinach. Onions. Leeks. Peas. Squash. Tomatoes. Peppers. Radishes. Grains Whole-grain pasta. Brown rice. Bulgur wheat. Polenta. Couscous. Whole-wheat bread. [place]. Meats and  other proteins Beans. Almonds. Sunflower seeds. Pine nuts. Peanuts. [place]. Salmon. Scallops. Shrimp. [place]. Tilapia. Clams. Oysters. Eggs. Poultry without skin. Dairy Low-fat milk. Cheese. Greek yogurt. Fats and oils Extra-virgin olive oil. Avocado oil. Grapeseed oil. Beverages Water. Red wine. Herbal tea. Sweets and desserts Greek yogurt with honey. Baked apples. Poached pears. Trail mix. Seasonings and condiments Basil. Cilantro. Coriander. Cumin. Mint. Parsley. Sage. Rosemary. Tarragon. Garlic. Oregano. Thyme. Pepper. Balsamic vinegar. Tahini. Hummus. Tomato sauce. Olives. Mushrooms. The items listed above may not be a complete list of foods and beverages you can eat. Contact a dietitian for more information. What foods should I limit? This is a list of foods that should be eaten rarely or only on special occasions. Fruits Fruit canned in syrup. Vegetables Deep-fried potatoes (french fries). Grains Prepackaged pasta or rice dishes. Prepackaged cereal with added sugar. Prepackaged snacks with added sugar. Meats and other proteins Beef. Pork. Lamb. Poultry with skin. Hot dogs. [place] Salines. Dairy Ice cream. Sour cream. Whole milk. Fats and oils Butter. Canola oil. Vegetable oil. Beef fat (tallow). Lard. Beverages Juice. Sugar-sweetened soft drinks. Beer. Liquor and spirits. Sweets and desserts Cookies. Cakes. Pies. Candy. Seasonings and condiments Mayonnaise. Pre-made sauces and marinades. The items listed above may not be a complete list of foods and beverages you should limit. Contact a dietitian  for more information. Summary The Mediterranean diet includes both food and lifestyle choices. Eat a variety of fresh fruits and vegetables, beans, nuts, seeds, and whole grains. Limit the amount of red meat and sweets that you eat. If recommended by your health care provider, drink red wine in moderation. This means 1 glass a day for nonpregnant women and 2 glasses a day for men. A glass of  wine equals 5 oz (150 mL). This information is not intended to replace advice given to you by your health care provider. Make sure you discuss any questions you have with your health care provider. Document Revised: 11/30/2019 Document Reviewed: 09/27/2019 Elsevier Patient Education  2022 [employer].

## 2022-01-27 ENCOUNTER — Other Ambulatory Visit: Payer: Self-pay | Admitting: Nurse Practitioner

## 2022-01-27 DIAGNOSIS — E785 Hyperlipidemia, unspecified: Secondary | ICD-10-CM | POA: Diagnosis not present

## 2022-01-28 ENCOUNTER — Other Ambulatory Visit: Payer: Self-pay | Admitting: Cardiovascular Disease

## 2022-01-28 LAB — COMPREHENSIVE METABOLIC PANEL
ALT: 17 IU/L (ref 0–44)
AST: 18 IU/L (ref 0–40)
Albumin/Globulin Ratio: 2 (ref 1.2–2.2)
Albumin: 4.4 g/dL (ref 3.8–4.8)
Alkaline Phosphatase: 109 IU/L (ref 44–121)
BUN/Creatinine Ratio: 14 (ref 10–24)
BUN: 13 mg/dL (ref 8–27)
Bilirubin Total: 0.4 mg/dL (ref 0.0–1.2)
CO2: 24 mmol/L (ref 20–29)
Calcium: 9.1 mg/dL (ref 8.6–10.2)
Chloride: 103 mmol/L (ref 96–106)
Creatinine, Ser: 0.93 mg/dL (ref 0.76–1.27)
Globulin, Total: 2.2 g/dL (ref 1.5–4.5)
Glucose: 102 mg/dL — ABNORMAL HIGH (ref 70–99)
Potassium: 4.3 mmol/L (ref 3.5–5.2)
Sodium: 138 mmol/L (ref 134–144)
Total Protein: 6.6 g/dL (ref 6.0–8.5)
eGFR: 90 mL/min/{1.73_m2} (ref >59)

## 2022-01-28 LAB — LIPID PANEL
Chol/HDL Ratio: 3.1 ratio (ref 0.0–5.0)
Cholesterol, Total: 143 mg/dL (ref 100–199)
HDL: 46 mg/dL (ref >39)
LDL Chol Calc (NIH): 85 mg/dL (ref 0–99)
Triglycerides: 57 mg/dL (ref 0–149)
VLDL Cholesterol Cal: 12 mg/dL (ref 5–40)

## 2022-02-06 ENCOUNTER — Encounter (HOSPITAL_BASED_OUTPATIENT_CLINIC_OR_DEPARTMENT_OTHER): Payer: Self-pay | Admitting: Cardiovascular Disease

## 2022-02-08 ENCOUNTER — Encounter (HOSPITAL_BASED_OUTPATIENT_CLINIC_OR_DEPARTMENT_OTHER): Payer: Self-pay

## 2022-02-08 NOTE — Telephone Encounter (Signed)
BP log as requested 

## 2022-02-25 DIAGNOSIS — D225 Melanocytic nevi of trunk: Secondary | ICD-10-CM | POA: Diagnosis not present

## 2022-02-25 DIAGNOSIS — D1801 Hemangioma of skin and subcutaneous tissue: Secondary | ICD-10-CM | POA: Diagnosis not present

## 2022-02-25 DIAGNOSIS — D2271 Melanocytic nevi of right lower limb, including hip: Secondary | ICD-10-CM | POA: Diagnosis not present

## 2022-02-25 DIAGNOSIS — L821 Other seborrheic keratosis: Secondary | ICD-10-CM | POA: Diagnosis not present

## 2022-02-25 DIAGNOSIS — D224 Melanocytic nevi of scalp and neck: Secondary | ICD-10-CM | POA: Diagnosis not present

## 2022-02-25 DIAGNOSIS — D2362 Other benign neoplasm of skin of left upper limb, including shoulder: Secondary | ICD-10-CM | POA: Diagnosis not present

## 2022-02-25 DIAGNOSIS — L918 Other hypertrophic disorders of the skin: Secondary | ICD-10-CM | POA: Diagnosis not present

## 2022-02-25 DIAGNOSIS — L820 Inflamed seborrheic keratosis: Secondary | ICD-10-CM | POA: Diagnosis not present

## 2022-02-25 DIAGNOSIS — L738 Other specified follicular disorders: Secondary | ICD-10-CM | POA: Diagnosis not present

## 2022-07-14 DIAGNOSIS — E7800 Pure hypercholesterolemia, unspecified: Secondary | ICD-10-CM | POA: Diagnosis not present

## 2022-07-14 DIAGNOSIS — Z1331 Encounter for screening for depression: Secondary | ICD-10-CM | POA: Diagnosis not present

## 2022-07-14 DIAGNOSIS — Q231 Congenital insufficiency of aortic valve: Secondary | ICD-10-CM | POA: Diagnosis not present

## 2022-07-14 DIAGNOSIS — K5090 Crohn's disease, unspecified, without complications: Secondary | ICD-10-CM | POA: Diagnosis not present

## 2022-07-14 DIAGNOSIS — I10 Essential (primary) hypertension: Secondary | ICD-10-CM | POA: Diagnosis not present

## 2022-07-14 DIAGNOSIS — I351 Nonrheumatic aortic (valve) insufficiency: Secondary | ICD-10-CM | POA: Diagnosis not present

## 2022-07-14 DIAGNOSIS — I77810 Thoracic aortic ectasia: Secondary | ICD-10-CM | POA: Diagnosis not present

## 2022-07-14 DIAGNOSIS — Z0000 Encounter for general adult medical examination without abnormal findings: Secondary | ICD-10-CM | POA: Diagnosis not present

## 2022-10-05 DIAGNOSIS — H2513 Age-related nuclear cataract, bilateral: Secondary | ICD-10-CM | POA: Diagnosis not present

## 2022-10-05 DIAGNOSIS — H5213 Myopia, bilateral: Secondary | ICD-10-CM | POA: Diagnosis not present

## 2022-10-05 DIAGNOSIS — H21233 Degeneration of iris (pigmentary), bilateral: Secondary | ICD-10-CM | POA: Diagnosis not present

## 2022-11-17 ENCOUNTER — Encounter (HOSPITAL_BASED_OUTPATIENT_CLINIC_OR_DEPARTMENT_OTHER): Payer: Self-pay | Admitting: Cardiovascular Disease

## 2022-11-17 DIAGNOSIS — I7121 Aneurysm of the ascending aorta, without rupture: Secondary | ICD-10-CM

## 2022-11-17 DIAGNOSIS — I712 Thoracic aortic aneurysm, without rupture, unspecified: Secondary | ICD-10-CM

## 2022-11-24 ENCOUNTER — Other Ambulatory Visit (HOSPITAL_BASED_OUTPATIENT_CLINIC_OR_DEPARTMENT_OTHER): Payer: Self-pay | Admitting: Cardiovascular Disease

## 2022-12-07 ENCOUNTER — Ambulatory Visit (HOSPITAL_COMMUNITY): Payer: PPO | Attending: Cardiovascular Disease

## 2022-12-07 DIAGNOSIS — I7121 Aneurysm of the ascending aorta, without rupture: Secondary | ICD-10-CM | POA: Diagnosis not present

## 2022-12-07 DIAGNOSIS — I712 Thoracic aortic aneurysm, without rupture, unspecified: Secondary | ICD-10-CM | POA: Diagnosis not present

## 2022-12-07 LAB — ECHOCARDIOGRAM COMPLETE
AR max vel: 2.22 cm2
AV Area VTI: 2.21 cm2
AV Area mean vel: 1.85 cm2
AV Mean grad: 6.8 mmHg
AV Peak grad: 10.3 mmHg
Ao pk vel: 1.6 m/s
Area-P 1/2: 2.54 cm2
P 1/2 time: 465 msec
S' Lateral: 2.9 cm

## 2022-12-09 ENCOUNTER — Ambulatory Visit (INDEPENDENT_AMBULATORY_CARE_PROVIDER_SITE_OTHER): Payer: PPO | Admitting: Family

## 2022-12-09 ENCOUNTER — Encounter (HOSPITAL_BASED_OUTPATIENT_CLINIC_OR_DEPARTMENT_OTHER): Payer: Self-pay | Admitting: Family

## 2022-12-09 VITALS — BP 118/72 | HR 66 | Ht 70.0 in | Wt 173.0 lb

## 2022-12-09 DIAGNOSIS — E785 Hyperlipidemia, unspecified: Secondary | ICD-10-CM

## 2022-12-09 DIAGNOSIS — I712 Thoracic aortic aneurysm, without rupture, unspecified: Secondary | ICD-10-CM

## 2022-12-09 DIAGNOSIS — Q231 Congenital insufficiency of aortic valve: Secondary | ICD-10-CM

## 2022-12-09 DIAGNOSIS — I25118 Atherosclerotic heart disease of native coronary artery with other forms of angina pectoris: Secondary | ICD-10-CM

## 2022-12-09 DIAGNOSIS — I351 Nonrheumatic aortic (valve) insufficiency: Secondary | ICD-10-CM | POA: Diagnosis not present

## 2022-12-09 DIAGNOSIS — I10 Essential (primary) hypertension: Secondary | ICD-10-CM | POA: Diagnosis not present

## 2022-12-09 NOTE — Progress Notes (Signed)
Office Visit    Patient Name: Ryan Burton Date of Encounter: 12/09/2022  PCP:  Lavone Orn, [place]  Cardiologist:  Skeet Latch, MD  Advanced Practice Provider:  No care team member to display Electrophysiologist:  None      Chief Complaint    Ryan Burton is a 67 y.o. male presents today for follow up of bicuspid aortic valve  Past Medical History    Past Medical History:  Diagnosis Date   Aneurysm of ascending aorta ([place]) 10/25/2016   4.4cm by echo 10/21/16   Atrial tachycardia 01/15/2017   Bicuspid aortic valve 10/25/2016   Chest pain    Chest tightness 11/25/2021   Cold intolerance 11/25/2021   Crohn's disease ([place])    Dizziness    Essential hypertension 09/04/2019   Moderate aortic regurgitation 10/25/2016   Palpitations 09/21/2016   Pure hypercholesterolemia 09/04/2019   PVC (premature ventricular contraction) 01/15/2017   Shortness of breath 01/15/2017   Past Surgical History:  Procedure Laterality Date   KIDNEY STONE SURGERY     PTERYGIUM EXCISION     SHOULDER SURGERY      Allergies  Allergies  Allergen Reactions   Atorvastatin Rash   Ivp Dye [Iodinated Contrast Media] Hives   Rosuvastatin Rash   Shellfish Allergy Diarrhea    History of Present Illness    Ryan Burton is a 67 y.o. male with a hx of mild nonobstructive coronary artery disease, moderate aortic regurgitation, bicuspid aortic valve, ascending aortic dilation, PVC, hypertension, Crohn's last seen 12/24/2021.  Established with cardiology in 2017 for chest pain and dizziness.  Due to murmur, echocardiogram ordered revealing LVEF 60 to 31%, grade 1 diastolic dysfunction, functional bicuspid aortic valve with moderate regurgitation, mild to moderate dilation of aortic root 4.4 cm.  Repeat echo is 01/2017, 03/2018 overall unchanged.  Echo 10/2019 moderate AI and ascending aorta 4.0 cm.  48-hour monitor with occasional PVC but otherwise normal  sinus rhythm.  ETT 10/2016 negative for ischemia.  Coronary CTA for chest pain 12/02/2021 with coronary calcium score of 20.2 placing him the 30th percentile for age and sex matched control with mild nonobstructive coronary disease.  Most recent Cardiac CTA 12/07/2022 LVEF 60 to 65%, no RWMA, grade 1 diastolic dysfunction, RV normal size and function, trivial MR, bicuspid aortic valve with moderate AI and no stenosis (mean gradient 6.8 mmHg), moderate dilation aortic root 46 mm, mild dilation ascending aorta 43 mm.  Presents today independently. Feeling well since last seen, enjoying spending time with his family and 82.35 month old goldendoodle puppy. Reports no shortness of breath nor dyspnea on exertion. Reports no chest pain, pressure, or tightness. No edema, orthopnea, PND. Reports no palpitations.  If he bends over and stands up quickly gets lightheadedness but no near syncope, syncope. We reviewed LDL goal of <70 as last check was 85. Previously took higher dose Atorvastatin and Rosuvastatin and developed rash and dry spots. He prefers to be on fewer medications if possible.     EKGs/Labs/Other Studies Reviewed:   The following studies were reviewed today: Echo 12/07/22    1. Left ventricular ejection fraction, by estimation, is 60 to 65%. Left  ventricular ejection fraction by 3D volume is 59 %. The left ventricle has  normal function. The left ventricle has no regional wall motion  abnormalities. Left ventricular diastolic   parameters are consistent with Grade I diastolic dysfunction (impaired  relaxation).   2. Right ventricular systolic  function is normal. The right ventricular  size is normal. There is normal pulmonary artery systolic pressure. The  estimated right ventricular systolic pressure is 73.5 mmHg.   3. The mitral valve is grossly normal. Trivial mitral valve  regurgitation. No evidence of mitral stenosis.   4. Forme fruste bicuspid aortic valve with partial fusion of right  and  left coronary cusps. The aortic valve is bicuspid. Aortic valve  regurgitation is moderate. Aortic valve sclerosis/calcification is  present, without any evidence of aortic stenosis.  Aortic valve area, by VTI measures 2.21 cm. Aortic valve mean gradient  measures 6.8 mmHg. Aortic valve Vmax measures 1.60 m/s.   5. Aortic dilatation noted. Aneurysm of the aortic root. There is  moderate dilatation of the aortic root, measuring 46 mm. There is mild  dilatation of the ascending aorta, measuring 43 mm.   6. The inferior vena cava is normal in size with greater than 50%  respiratory variability, suggesting right atrial pressure of 3 mmHg.   Cardiac CTA 12/02/2021 Aorta: Proximal ascending aorta is aneurysmal (40.1 mm). No calcifications. No dissection.   Aortic Valve:  Trileaflet.  No calcifications.   Coronary Arteries:  Normal coronary origin.  Right dominance.   RCA is a large dominant artery that gives rise to PDA and PLA. There is no plaque.   Left main is a large artery that gives rise to LAD and LCX arteries.   LAD is a large vessel. The LAD with no plaques. Mild (25-49%) calcification at the ostium of the D1 vessel.   LCX is a non-dominant artery that gives rise to one large OM1 branch. There is no plaque.   Coronary Calcium Score:   Left main: 0   Left anterior descending artery: 17.7   Left circumflex artery: 0   Right coronary artery: 2.49   Total: 20.2   Percentile: 30   Other findings:   Normal pulmonary vein drainage into the left atrium.   Normal left atrial appendage without a thrombus.   Normal size of the pulmonary artery.   IMPRESSION: 1. Coronary calcium score of 20.2. This was 50 percentile for age and sex matched control.   2. Normal coronary origin with right dominance.   3. Mild Coronary artery disease. CAD-RADS 2. Mild non-obstructive CAD (25-49%). Consider non-atherosclerotic causes of chest pain. Consider preventive therapy and  risk factor modification.   4. Proximal ascending aorta aneurysm (40.1 mm).   EKG:  EKG is  ordered today.  The ekg ordered today demonstrates NSR 66 bpm with no acute ST/ T wave changes.   Recent Labs: 01/27/2022: ALT 17; BUN 13; Creatinine, Ser 0.93; Potassium 4.3; Sodium 138  Recent Lipid Panel    Component Value Date/Time   CHOL 143 01/27/2022 0925   TRIG 57 01/27/2022 0925   HDL 46 01/27/2022 0925   CHOLHDL 3.1 01/27/2022 0925   LDLCALC 85 01/27/2022 0925   Home Medications   Current Meds  Medication Sig   atorvastatin (LIPITOR) 10 MG tablet Take 0.5 tablets (5 mg total) by mouth daily.   cyanocobalamin 1000 MCG tablet Take 1,000 mcg by mouth.   Multiple Vitamins-Minerals (MULTIVITAMIN WITH MINERALS) tablet Take 1 tablet by mouth daily.   valsartan (DIOVAN) 80 MG tablet TAKE 1 TABLET BY MOUTH EVERY DAY     Review of Systems      All other systems reviewed and are otherwise negative except as noted above.  Physical Exam    VS:  BP 118/72   Pulse  66   Ht '5\' 10"'$  (1.778 m)   Wt 173 lb (78.5 kg)   BMI 24.82 kg/m  , BMI Body mass index is 24.82 kg/m.  Wt Readings from Last 3 Encounters:  12/09/22 173 lb (78.5 kg)  12/24/21 172 lb 4.8 oz (78.2 kg)  11/25/21 174 lb 12.8 oz (79.3 kg)     GEN: Well nourished, well developed, in no acute distress. HEENT: normal. Neck: Supple, no JVD, carotid bruits, or masses. Cardiac: RRR, no murmurs, rubs, or gallops. No clubbing, cyanosis, edema.  Radials/PT 2+ and equal bilaterally.  Respiratory:  Respirations regular and unlabored, clear to auscultation bilaterally. GI: Soft, nontender, nondistended. MS: No deformity or atrophy. Skin: Warm and dry, no rash. Neuro:  Strength and sensation are intact. Psych: Normal affect.  Assessment & Plan    Bicuspid aortic valve / Moderate aortic regurgitation-stable by echo 11/2022.  No chest pain, dyspnea.  Only mild lightheadedness with quick position changes more consistent with  orthostasis.  Plan for monitoring with annual echocardiogram.  Thoracic aortic aneurysm -CT 12/07/2022's stable mild dilation aortic aorta 43 mm and moderate dilation aortic root 46 mm.  Continue optimal blood pressure control.  Avoid fluoroquinolones.  Repeat echo in 1 year for monitoring.  CAD/HLD, LDL goal less than 70-most recent LDL of 85 however >6 mos ago.  Discussed goal of less than 70.  Previously had rash on higher doses of atorvastatin.  He prefers to be on as few medications as medically necessary.  Will update fasting lipid panel and NMR LipoProfile prior to adjustment of lipid-lowering therapy.  Hypertension- BP well controlled. Continue current antihypertensive regimen.           Disposition: Follow up in 1 year(s) with Skeet Latch, MD or APP.  Signed, Loel Dubonnet, NP 12/09/2022, 1:58 PM [facility]

## 2022-12-09 NOTE — Patient Instructions (Addendum)
Medication Instructions:  Your Physician recommend you continue on your current medication as directed.    *If you need a refill on your cardiac medications before your next appointment, please call your pharmacy*   Lab Work: Please return for Lab work in the next couple week for Fasting Lipid Panel and NMR. You may come to the...   [facility] (3rd floor) [address] [place], [place] [address]  Open: 8am-Noon and 1pm-4:30pm  Please ring the doorbell on the small table when you exit the elevator and the Lab Tech will come get you  [place] at [facility] [address] [place], [place], [place] [address] Open: 8am-1pm, then 2pm-4:30pm   [place]- Please see attached locations sheet stapled to your lab work with address and hours.   If you have labs (blood work) drawn today and your tests are completely normal, you will receive your results only by: [place] (if you have MyChart) OR A paper copy in the mail If you have any lab test that is abnormal or we need to change your treatment, we will call you to review the results.   Testing/Procedures: Your physician has requested that you have an echocardiogram in one year . Echocardiography is a painless test that uses sound waves to create images of your heart. It provides your doctor with information about the size and shape of your heart and how well your heart's chambers and valves are working. This procedure takes approximately one hour. There are no restrictions for this procedure. Please do NOT wear cologne, perfume, aftershave, or lotions (deodorant is allowed). Please arrive 15 minutes prior to your appointment time.    Follow-Up: At [facility], you and your health needs are our priority.  As part of our continuing mission to provide you with exceptional heart care, we have created designated Provider Care Teams.  These Care Teams include your primary Cardiologist  (physician) and Advanced Practice Providers (APPs -  Physician Assistants and Nurse Practitioners) who all work together to provide you with the care you need, when you need it.  We recommend signing up for the patient portal called "MyChart".  Sign up information is provided on this After Visit Summary.  MyChart is used to connect with patients for Virtual Visits (Telemedicine).  Patients are able to view lab/test results, encounter notes, upcoming appointments, etc.  Non-urgent messages can be sent to your provider as well.   To learn more about what you can do with MyChart, go to [email or web address].    Your next appointment:   1 year(s)  Provider:   Skeet Latch, MD    Other Instructions Your echocardiogram was stable compared to previous.

## 2022-12-10 ENCOUNTER — Encounter (HOSPITAL_BASED_OUTPATIENT_CLINIC_OR_DEPARTMENT_OTHER): Payer: Self-pay | Admitting: Family

## 2022-12-13 DIAGNOSIS — I25118 Atherosclerotic heart disease of native coronary artery with other forms of angina pectoris: Secondary | ICD-10-CM | POA: Diagnosis not present

## 2022-12-13 DIAGNOSIS — I10 Essential (primary) hypertension: Secondary | ICD-10-CM | POA: Diagnosis not present

## 2022-12-13 DIAGNOSIS — E785 Hyperlipidemia, unspecified: Secondary | ICD-10-CM | POA: Diagnosis not present

## 2022-12-14 DIAGNOSIS — L820 Inflamed seborrheic keratosis: Secondary | ICD-10-CM | POA: Diagnosis not present

## 2022-12-14 LAB — NMR, LIPOPROFILE
Cholesterol, Total: 159 mg/dL (ref 100–199)
HDL Particle Number: 37.5 umol/L
HDL-C: 49 mg/dL
LDL Particle Number: 1119 nmol/L — ABNORMAL HIGH
LDL Size: 20.8 nm
LDL-C (NIH Calc): 93 mg/dL (ref 0–99)
LP-IR Score: 70 — ABNORMAL HIGH
Small LDL Particle Number: 479 nmol/L
Triglycerides: 88 mg/dL (ref 0–149)

## 2022-12-14 LAB — LIPID PANEL
Chol/HDL Ratio: 3.3 ratio (ref 0.0–5.0)
Cholesterol, Total: 157 mg/dL (ref 100–199)
HDL: 48 mg/dL
LDL Chol Calc (NIH): 92 mg/dL (ref 0–99)
Triglycerides: 93 mg/dL (ref 0–149)
VLDL Cholesterol Cal: 17 mg/dL (ref 5–40)

## 2022-12-16 ENCOUNTER — Telehealth (HOSPITAL_BASED_OUTPATIENT_CLINIC_OR_DEPARTMENT_OTHER): Payer: Self-pay

## 2022-12-16 NOTE — Telephone Encounter (Addendum)
Left message for patient to call back     ----- Message from Loel Dubonnet, NP sent at 12/15/2022  1:02 PM EST ----- LDL particle number moderately elevated. LDL of 93 which is above goal of <70. Total cholesterol, triglycerides, HDL at goal.   Discussed in clinic that he had rash on higher doses of Atorvastatin and Rosuvastatin. We could add a medication called Zetia with is a non-statin medication that works with Atorvastatin to specifically target LDL (bad cholesterol).  He would also be a good candidate for injectable cholesterol medication. Some are every 2 weeks (PCSK9i) and others are every 6 months (Incliseron). If he would like to discuss this option, recommend referral to pharmacy lipid clinic.

## 2022-12-17 NOTE — Telephone Encounter (Signed)
Seen by patient Ryan Burton on 12/17/2022  8:14 AM; follow up mychart message sent to patient.

## 2022-12-20 MED ORDER — EZETIMIBE 10 MG PO TABS: 10.0000 mg | ORAL_TABLET | Freq: Every day | ORAL | 3 refills | Status: DC

## 2022-12-20 NOTE — Addendum Note (Signed)
Addended by: Gerald Stabs on: 12/20/2022 10:04 AM   Modules accepted: Orders

## 2023-01-24 ENCOUNTER — Encounter (HOSPITAL_BASED_OUTPATIENT_CLINIC_OR_DEPARTMENT_OTHER): Payer: Self-pay

## 2023-01-24 DIAGNOSIS — E785 Hyperlipidemia, unspecified: Secondary | ICD-10-CM

## 2023-01-25 NOTE — Telephone Encounter (Signed)
Uncommon side effects for Zetia. However, given intolerance recommend discontinue and referral to pharmacy lipid team to discuss alternatives.  Loel Dubonnet, NP

## 2023-01-25 NOTE — Telephone Encounter (Signed)
Please advise for new cholesterol management 

## 2023-02-23 NOTE — Progress Notes (Unsigned)
Patient ID: [name]                 DOB: 04/27/1956                    MRN: [id number]      HPI: Ryan Burton is a 67 y.o. male patient referred to lipid clinic by Gillian Shields, NP. PMH is significant for mild nonobstructive coronary artery disease, moderate aortic regurgitation, bicuspid aortic valve, ascending aortic dilation, PVC, hypertension, Crohn's.   Patient presented today with his wife. Reports since he started taking Lipitor 5 mg and Zetia he has been experiencing muscle aches and cramps. Which affects his sleep too. Also statins gives him rashes and severely dry hand skin. He has tried Crestor all different doses in the past and was not able to tolerate due to same reasons. He eats healthy low fat, low carb  diet but ice creams is his weakness. Drinks 14 drinks per week which he is trying to cut down on.    Current Medications: Lipitor 5 mg daily, ezetimibe 10 daily started in Feb 2024   Intolerances: atorvastatin and rosuvastatin - myalgia and severely dry skin  Zetia- body aches and cramps sleep disturbance due to pain   Risk Factors: CAD, HTN, 10 years ASCVD risk score 14.2% LDL goal: <70 mg/dl   Diet: low fat, low carb diet. Ice cream is weakness   Exercise: active around the house and loves playing golf   Family History: father lived till  78 had dementia  mother  34 and still alive    Social History:  Alcohol: 14 drinks per week  Smoking: never   Labs: Lipid Panel     Component Value Date/Time   CHOL 157 12/13/2022 0817   TRIG 93 12/13/2022 0817   HDL 48 12/13/2022 0817   CHOLHDL 3.3 12/13/2022 0817   LDLCALC 92 12/13/2022 0817   LABVLDL 17 12/13/2022 0817    Past Medical History:  Diagnosis Date   Aneurysm of ascending aorta (HCC) 10/25/2016   4.4cm by echo 10/21/16   Atrial tachycardia 01/15/2017   Bicuspid aortic valve 10/25/2016   Chest pain    Chest tightness 11/25/2021   Cold intolerance 11/25/2021   Crohn's disease (HCC)     Dizziness    Essential hypertension 09/04/2019   Moderate aortic regurgitation 10/25/2016   Palpitations 09/21/2016   Pure hypercholesterolemia 09/04/2019   PVC (premature ventricular contraction) 01/15/2017   Shortness of breath 01/15/2017    Current Outpatient Medications on File Prior to Visit  Medication Sig Dispense Refill   atorvastatin (LIPITOR) 10 MG tablet Take 0.5 tablets (5 mg total) by mouth daily. 45 tablet 3   cyanocobalamin 1000 MCG tablet Take 1,000 mcg by mouth.     Multiple Vitamins-Minerals (MULTIVITAMIN WITH MINERALS) tablet Take 1 tablet by mouth daily.     valsartan (DIOVAN) 80 MG tablet TAKE 1 TABLET BY MOUTH EVERY DAY 90 tablet 3   No current facility-administered medications on file prior to visit.    Allergies  Allergen Reactions   Atorvastatin Rash   Ivp Dye [Iodinated Contrast Media] Hives   Rosuvastatin Rash   Shellfish Allergy Diarrhea    Problem  Pure Hypercholesterolemia    Current Medications: Lipitor 5 mg daily, ezetimibe 10 daily started in Feb 2024   Intolerances: atorvastatin and rosuvastatin - myalgia and severely dry skin  Zetia- body aches and cramps sleep disturbance due to pain   Risk Factors: CAD, HTN,  10 years ASCVD risk score 14.2% LDL goal: <70 mg/dl     Pure hypercholesterolemia Assessment:  LDL goal: < 70 mg/dl last LDLc 92  mg/dl (40/98/1191) Current Medications: Lipitor 5 mg daily, ezetimibe 10 daily started in Feb 2024   Intolerances: atorvastatin and rosuvastatin - myalgia and severely dry skin  Zetia- body aches and cramps sleep disturbance due to pain   Risk Factors: CAD, HTN, 10 years ASCVD risk score 14.2% Discussed next potential options (PCSK-9 inhibitors, bempedoic acid and inclisiran); cost, dosing efficacy, side effects   Plan: Stop taking Zetia and atorvastatin 5 mg  Will apply for PA for PCSK9i; will inform patient upon approval (prefers MyChart message) Lipid lab due in 2-3 months after starting  PCSK9i    Thank you,  Carmela Hurt, Pharm.D [facility] A Division of [facility] [address] [place], [place] [address]  Phone: [phone]; Fax: [phone]

## 2023-02-24 ENCOUNTER — Encounter: Payer: Self-pay | Admitting: Student

## 2023-02-24 ENCOUNTER — Telehealth: Payer: Self-pay | Admitting: Pharmacist

## 2023-02-24 ENCOUNTER — Ambulatory Visit: Payer: PPO | Attending: Cardiology | Admitting: Student

## 2023-02-24 DIAGNOSIS — E7800 Pure hypercholesterolemia, unspecified: Secondary | ICD-10-CM

## 2023-02-24 NOTE — Patient Instructions (Addendum)
Your Results:             Your most recent labs Goal  Total Cholesterol 157 < 200  Triglycerides 93 < 150  HDL (happy/good cholesterol) 48 > 40  LDL (lousy/bad cholesterol 92 < 70   Medication changes: We will start the process to get PCSK9i (Repatha and Praluent)  covered by your insurance.  Once the prior authorization is complete, we will call you to let you know and confirm pharmacy information.      Praluent is a cholesterol medication that improved your body's ability to get rid of "bad cholesterol" known as LDL. It can lower your LDL up to 60%. It is an injection that is given under the skin every 2 weeks. The most common side effects of Praluent include runny nose, symptoms of the common cold, rarely flu or flu-like symptoms, back/muscle pain in about 3-4% of the patients, and redness, pain, or bruising at the injection site.    Repatha is a cholesterol medication that improved your body's ability to get rid of "bad cholesterol" known as LDL. It can lower your LDL up to 60%! It is an injection that is given under the skin every 2 weeks. The most common side effects of Repatha include runny nose, symptoms of the common cold, rarely flu or flu-like symptoms, back/muscle pain in about 3-4% of the patients, and redness, pain, or bruising at the injection site.   Lab orders: We want to repeat labs after 2-3 months.  We will send you a lab order to remind you once we get closer to that time.

## 2023-02-24 NOTE — Assessment & Plan Note (Signed)
Assessment:  LDL goal: < 70 mg/dl last LDLc 92  mg/dl (45/40/9811) Current Medications: Lipitor 5 mg daily, ezetimibe 10 daily started in Feb 2024   Intolerances: atorvastatin and rosuvastatin - myalgia and severely dry skin  Zetia- body aches and cramps sleep disturbance due to pain   Risk Factors: CAD, HTN, 10 years ASCVD risk score 14.2% Discussed next potential options (PCSK-9 inhibitors, bempedoic acid and inclisiran); cost, dosing efficacy, side effects   Plan: Stop taking Zetia and atorvastatin 5 mg  Will apply for PA for PCSK9i; will inform patient upon approval (prefers MyChart message) Lipid lab due in 2-3 months after starting [facility]

## 2023-02-28 ENCOUNTER — Telehealth: Payer: Self-pay

## 2023-02-28 ENCOUNTER — Other Ambulatory Visit (HOSPITAL_COMMUNITY): Payer: Self-pay

## 2023-02-28 NOTE — Telephone Encounter (Signed)
Pharmacy Patient Advocate Encounter  Prior Authorization for REPATHA has been approved.    Effective dates: 02/28/23 through 08/30/23  Haze Rushing, CPhT Pharmacy Patient Advocate Specialist Direct Number: [phone] Fax: [phone]

## 2023-02-28 NOTE — Telephone Encounter (Signed)
Pharmacy Patient Advocate Encounter   Received notification from [employer] that prior authorization for REPATHA is needed.    PA submitted on 02/28/23 Key [id number] Status is pending  Haze Rushing, CPhT Pharmacy Patient Advocate Specialist Direct Number: [phone] Fax: [phone]

## 2023-02-28 NOTE — Telephone Encounter (Signed)
Pending, see separate encounter

## 2023-02-28 NOTE — Telephone Encounter (Signed)
Called patient to inform PA approval. Patient would like to hold on to all cholesterol medications as dry skin symptoms are improving of off Zetia and statin. Will reach out to [place] via MyChart in 2 week whenever he is ready to initiate Repatha.

## 2023-03-01 ENCOUNTER — Other Ambulatory Visit: Payer: Self-pay | Admitting: Nurse Practitioner

## 2023-03-13 ENCOUNTER — Encounter (HOSPITAL_BASED_OUTPATIENT_CLINIC_OR_DEPARTMENT_OTHER): Payer: Self-pay

## 2023-03-13 DIAGNOSIS — E785 Hyperlipidemia, unspecified: Secondary | ICD-10-CM

## 2023-03-14 NOTE — Telephone Encounter (Signed)
Patient sent this message for you!

## 2023-03-14 NOTE — Telephone Encounter (Signed)
Spoke to patient, prefers to stay on oral for now wanted to check fasting lipid lab in 2-3 weeks. If LDLc still above the goal will initiate injectables. Patient does not like injectables.

## 2023-04-11 ENCOUNTER — Other Ambulatory Visit: Payer: Self-pay

## 2023-04-11 DIAGNOSIS — E785 Hyperlipidemia, unspecified: Secondary | ICD-10-CM

## 2023-04-11 LAB — LIPID PANEL
Chol/HDL Ratio: 2.9 ratio (ref 0.0–5.0)
Cholesterol, Total: 137 mg/dL (ref 100–199)
HDL: 48 mg/dL (ref >39)
LDL Chol Calc (NIH): 73 mg/dL (ref 0–99)
Triglycerides: 85 mg/dL (ref 0–149)
VLDL Cholesterol Cal: 16 mg/dL (ref 5–40)

## 2023-04-14 ENCOUNTER — Encounter (HOSPITAL_BASED_OUTPATIENT_CLINIC_OR_DEPARTMENT_OTHER): Payer: Self-pay

## 2023-04-14 ENCOUNTER — Other Ambulatory Visit: Payer: Self-pay | Admitting: Nurse Practitioner

## 2023-04-14 NOTE — Telephone Encounter (Signed)
Patient responding to you  ?

## 2023-04-14 NOTE — Telephone Encounter (Signed)
Patient following up with you.

## 2023-05-10 IMAGING — CT CT ANGIO CHEST
2 of 4 series · 16 of 36 positions shown · IV contrast (APPLIED)
Comparison: No priors.

CLINICAL DATA: 65-year-old male with history of thoracic aortic
aneurysm. Follow-up study.

EXAM:
CT ANGIOGRAPHY CHEST WITH CONTRAST
TECHNIQUE: Multidetector CT imaging of the chest was performed using the
standard protocol during bolus administration of intravenous
contrast. Multiplanar CT image reconstructions and MIPs were
obtained to evaluate the vascular anatomy.

[Series 3: chest w · axial · 0.65mm/px · z∈[-311,-63]mm · 13 of 146 slices shown]
[im 11/146  lung]
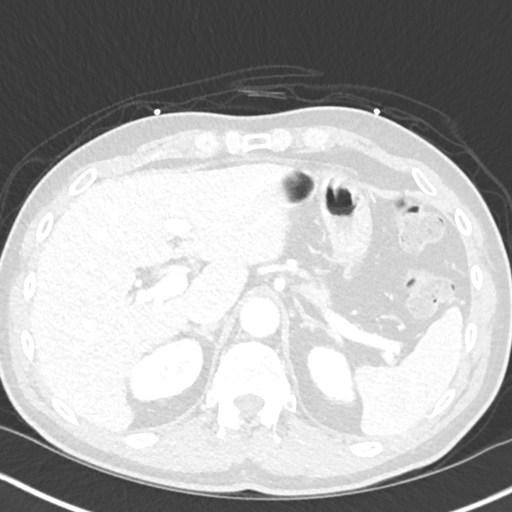
[im 21/146  mediastinal]
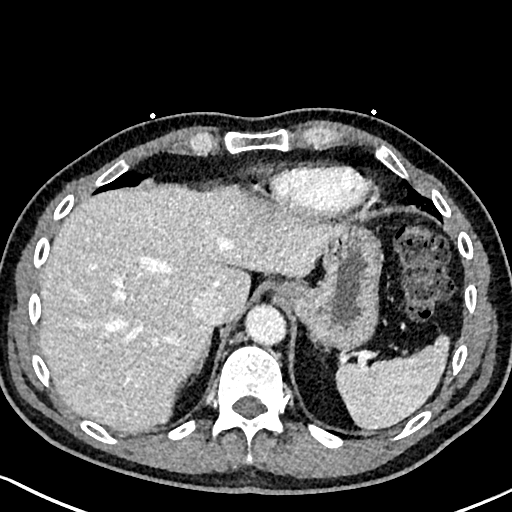
[im 32/146  lung]
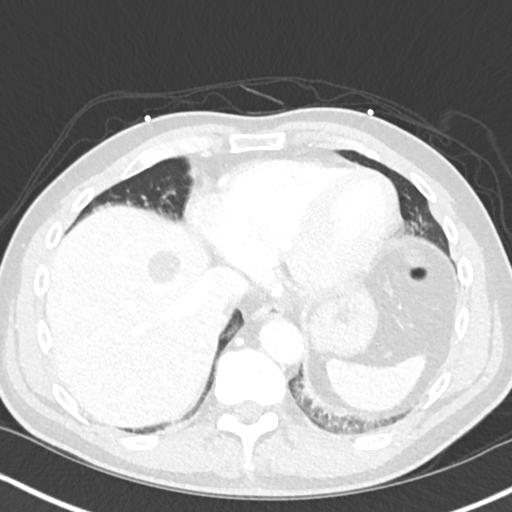
[im 42/146  mediastinal]
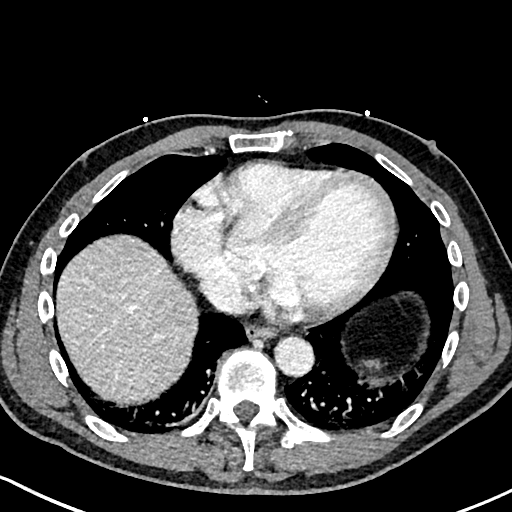
[im 52/146  lung]
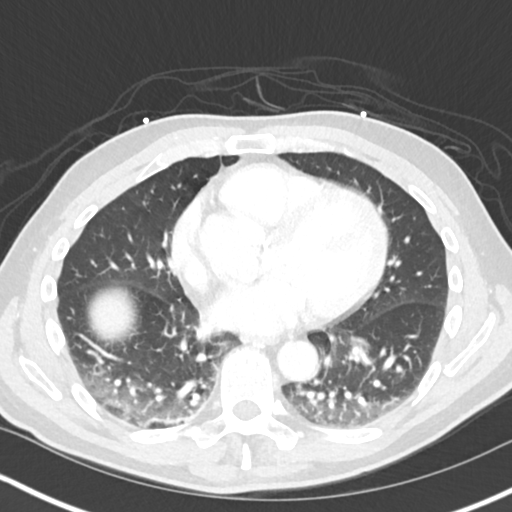
[im 63/146  mediastinal]
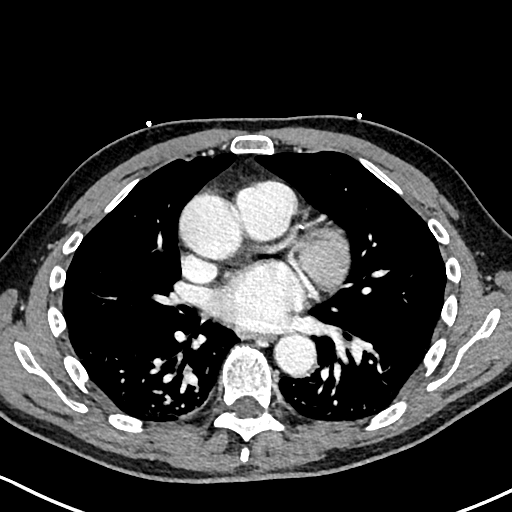
[im 73/146  lung]
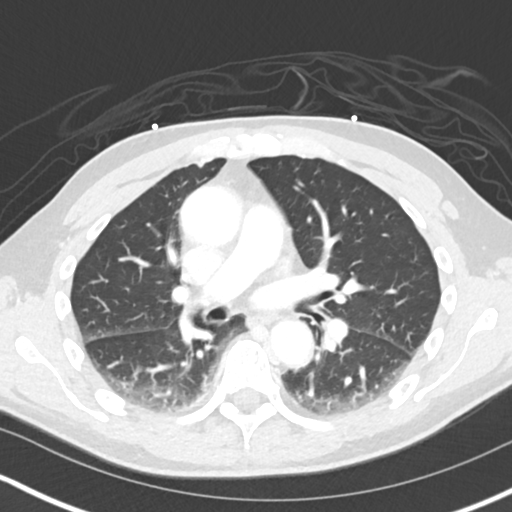
[im 83/146  mediastinal]
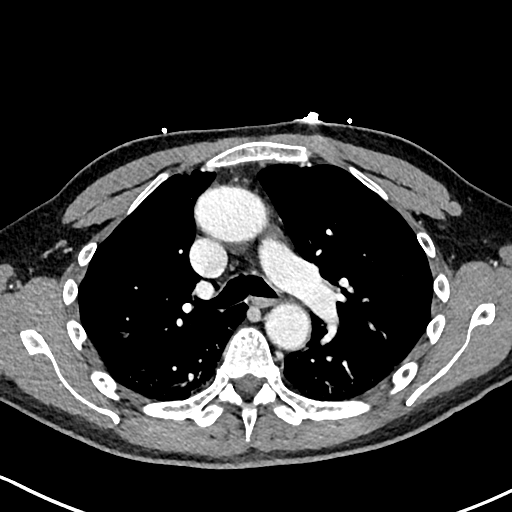
[im 94/146  lung]
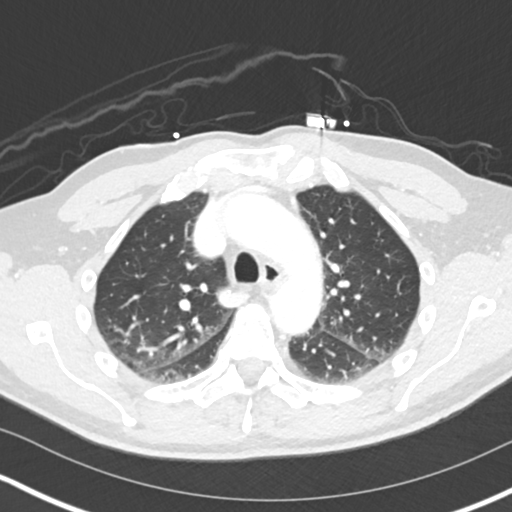
[im 104/146  mediastinal]
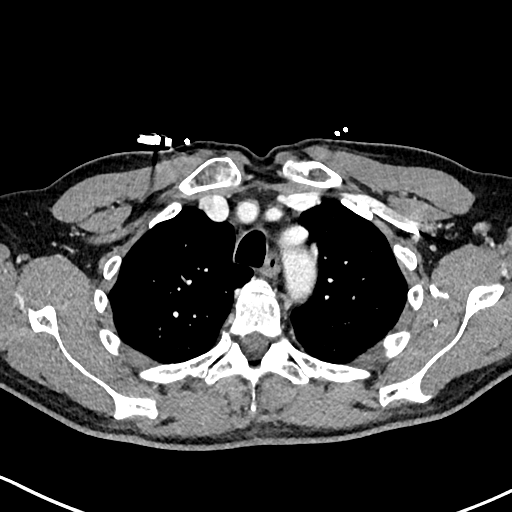
[im 114/146  lung]
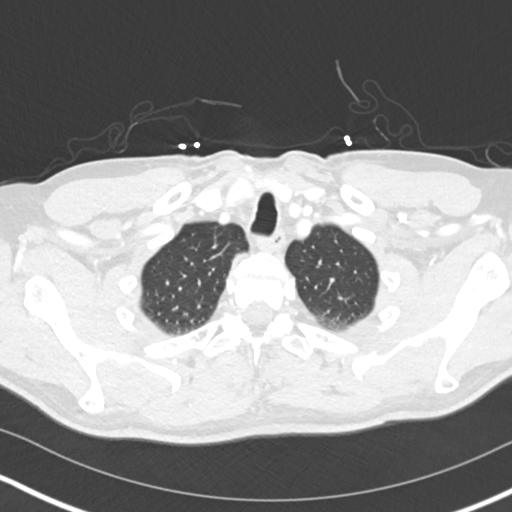
[im 125/146  mediastinal]
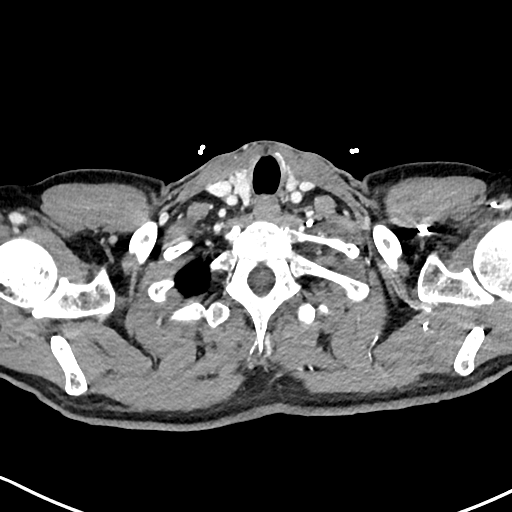
[im 135/146  lung]
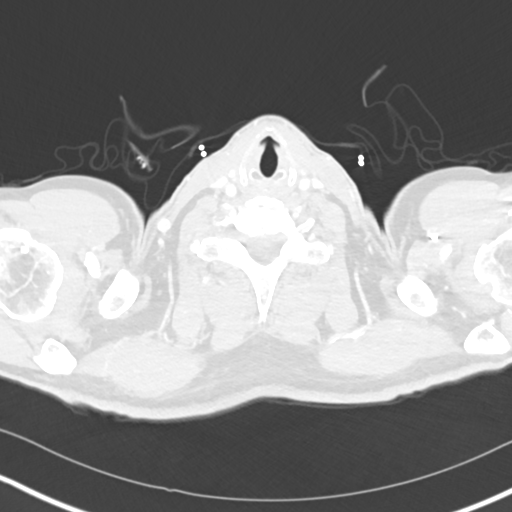

[Series 6: cor · coronal · 0.59mm/px · 3 of 144 slices shown]
[im 29/144  mediastinal]
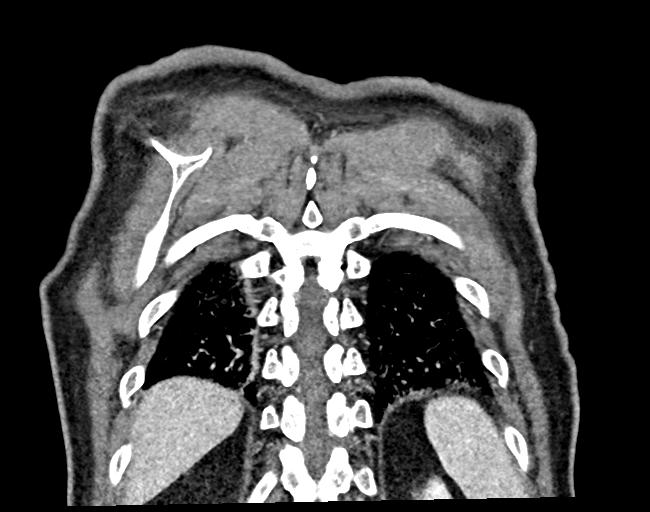
[im 58/144  mediastinal]
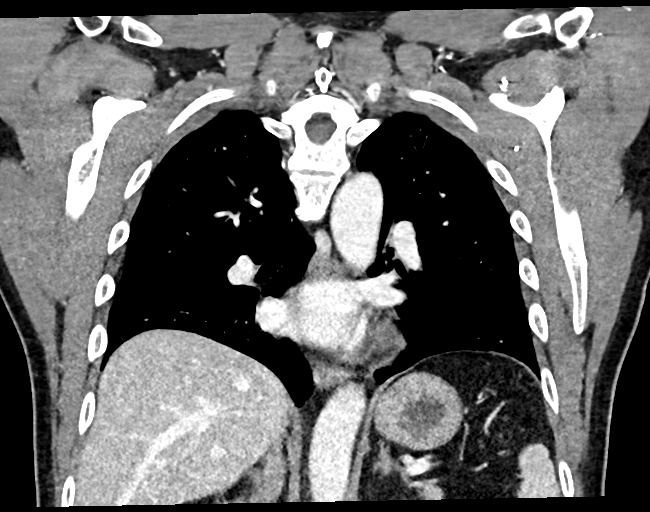
[im 86/144  mediastinal]
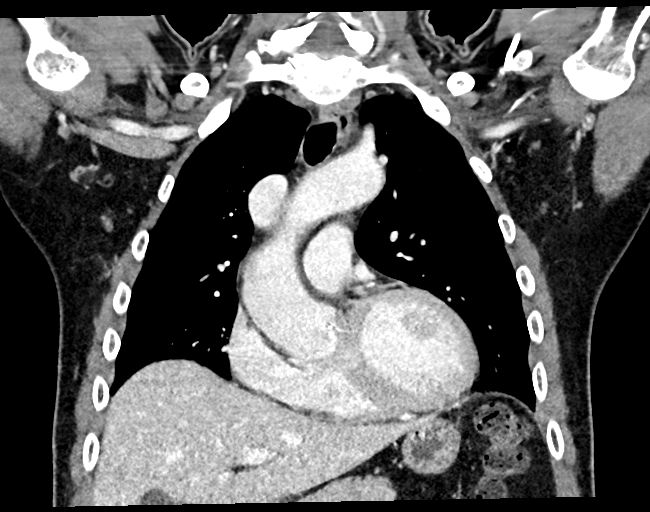

[16 of 36 positions shown; findings below may reference images not displayed]

RADIATION DOSE REDUCTION: This exam was performed according to the
departmental dose-optimization program which includes automated
exposure control, adjustment of the mA and/or kV according to
patient size and/or use of iterative reconstruction technique.

CONTRAST:  95mL OMNIPAQUE IOHEXOL 350 MG/ML SOLN
FINDINGS: Cardiovascular: Heart size is mildly enlarged. There is no
significant pericardial fluid, thickening or pericardial
calcification. There is aortic atherosclerosis, as well as
atherosclerosis of the great vessels of the mediastinum and the
coronary arteries, including calcified atherosclerotic plaque in the
left anterior descending coronary artery. Thickening and
calcification of the aortic valve. Ectasia of ascending thoracic
aorta (4.0 cm in diameter).

Mediastinum/Nodes: No pathologically enlarged mediastinal or hilar
lymph nodes. Esophagus is unremarkable in appearance. No axillary
lymphadenopathy.

Lungs/Pleura: No suspicious appearing pulmonary nodules or masses
are noted. No acute consolidative airspace disease. No pleural
effusions. Areas of dependent atelectasis are noted in the lungs
bilaterally.

Upper Abdomen: 2.3 cm low-attenuation lesion in segment 8 of the
liver, compatible with a simple cyst.

Musculoskeletal: There are no aggressive appearing lytic or blastic
lesions noted in the visualized portions of the skeleton.

Review of the MIP images confirms the above findings.
IMPRESSION: 1. Aortic atherosclerosis with ectasia of ascending thoracic aorta
(4.0 cm in diameter). Recommend annual imaging followup by CTA or
MRA. This recommendation follows 0090
ACCF/AHA/AATS/ACR/ASA/SCA/NASIR UDDIN/GOSIA/WAJIHA/ADLAHO Guidelines for the
Diagnosis and Management of Patients with Thoracic Aortic Disease.
Circulation. 0090; 121: E266-e369. Aortic aneurysm NOS
(STAYQ-3DM.1).
2. There is also left anterior descending coronary artery disease.
3. There are calcifications of the aortic valve. Echocardiographic
correlation for evaluation of potential valvular dysfunction may be
warranted if clinically indicated.

Aortic Atherosclerosis (STAYQ-659.9).

## 2023-08-13 ENCOUNTER — Encounter (HOSPITAL_BASED_OUTPATIENT_CLINIC_OR_DEPARTMENT_OTHER): Payer: Self-pay | Admitting: Cardiovascular Disease

## 2023-08-22 ENCOUNTER — Ambulatory Visit: Payer: PPO | Attending: Cardiovascular Disease

## 2023-08-22 ENCOUNTER — Ambulatory Visit (HOSPITAL_BASED_OUTPATIENT_CLINIC_OR_DEPARTMENT_OTHER): Payer: PPO | Admitting: Cardiovascular Disease

## 2023-08-22 ENCOUNTER — Encounter (HOSPITAL_BASED_OUTPATIENT_CLINIC_OR_DEPARTMENT_OTHER): Payer: Self-pay | Admitting: Cardiovascular Disease

## 2023-08-22 VITALS — BP 124/78 | HR 64 | Ht 70.0 in | Wt 171.2 lb

## 2023-08-22 DIAGNOSIS — Q2381 Bicuspid aortic valve: Secondary | ICD-10-CM

## 2023-08-22 DIAGNOSIS — I4719 Other supraventricular tachycardia: Secondary | ICD-10-CM

## 2023-08-22 DIAGNOSIS — I351 Nonrheumatic aortic (valve) insufficiency: Secondary | ICD-10-CM | POA: Diagnosis not present

## 2023-08-22 DIAGNOSIS — R002 Palpitations: Secondary | ICD-10-CM

## 2023-08-22 DIAGNOSIS — I480 Paroxysmal atrial fibrillation: Secondary | ICD-10-CM

## 2023-08-22 DIAGNOSIS — I493 Ventricular premature depolarization: Secondary | ICD-10-CM

## 2023-08-22 DIAGNOSIS — I7121 Aneurysm of the ascending aorta, without rupture: Secondary | ICD-10-CM

## 2023-08-22 DIAGNOSIS — R739 Hyperglycemia, unspecified: Secondary | ICD-10-CM

## 2023-08-22 DIAGNOSIS — E7800 Pure hypercholesterolemia, unspecified: Secondary | ICD-10-CM

## 2023-08-22 DIAGNOSIS — I10 Essential (primary) hypertension: Secondary | ICD-10-CM

## 2023-08-22 HISTORY — DX: Paroxysmal atrial fibrillation: I48.0

## 2023-08-22 NOTE — Progress Notes (Deleted)
Cardiology Office Note:  .   Date:  08/22/2023  ID:  Ryan Burton, DOB [birth year], MRN [id number] PCP: Ryan Funk, MD (Inactive)  [facility] Providers Cardiologist:  Chilton Si, MD { Click to update primary MD,subspecialty MD or APP then REFRESH:1}   History of Present Illness: .   Ryan Burton is a 67 y.o. male with a bicuspid aortic valve, mild ascending aorta aneurysm, moderate aortic regurgitation, and Crohn's disease who presents for follow-up.  Ryan Burton saw Ryan Freshwater, NP on 09/07/16 and reported episods of dizzness and chest pain.   He was evaluated in clinic 09/21/16. He was noted to have a murmur on exam. He was referred for an echocardiogram 10/21/16 that revealed LVEF 60-65% with grade 1 diastolic dysfunction. He was also noted to have a functionally bicuspid aortic valve with moderate aortic regurgitation. He also had mild to moderate dilation of the aortic root measuring 4.4 cm.  He had a repeat echo 01/2017 and 03/2018 that were essentially unchanged.  Echo 10/2019 revealed moderate AR and the ascending aorta was 4.0 cm.  He wore a 48 hour Holter that showed occasional PVCs but was otherwise unremarkable.  He also had an ETT 10/2016 that was negative for ischemia.     Mr. Ryan Burton blood pressure had been intermittently above goal.  He was typically able to control it with diet and exercise. He had a repeat echocardiogram 11/2020 that was unchanged from prior with moderate aortic regurgitation.  Ascending aorta was 4.0 cm.  He reported a diffuse rash that was discovered to be due to rosuvastatin. After stopping the statin his rash resolved. He also complained of leg cramping and stiffness at times that was unchanged while on the statin. He also developed a rask on atorvastatin.  At his appointment 11/2021 he reported blood pressures that were mostly controlled.  He had nonexertional chest discomfort at times but none with exercise.  Coronary CTA 11/2021  revealed a coronary calcium score of 20.2 which was 30th percentile.  He had mild disease in the LAD and his ascending aorta remains stable at 4.0 cm.  Today, he is feeling pretty good. He presents a BP log, which generally shows more well-controlled readings recently such as 110s-130s, and sometimes in the 140s. At this time he is feeling "a little bit" of central chest pressure that he describes as a little bit of discomfort or tightness. This has occurred randomly at times, but does not seem to change with movement. Of note, he states this may be related to his recent cold and residual congestion. Over the past year, he has noticed that he is unable to tolerate coldness as well as he did previously. He is unsure if this is related to age or his heart. Currently he is renovating his kitchen, and has not been on his normal diet or exercise routines in the past 1.5 months. He has been ordering out more often. He tries to eat apples, granola, sandwiches, and salads with a protein when he is able. About 2 weeks ago he was sick with a head cold, and feeling lethargic. Combined with rainy and cold weather he has not been motivated to exercise lately. He denies any palpitations, or shortness of breath. No lightheadedness, headaches, syncope, orthopnea, PND, lower extremity edema or exertional symptoms.  Echo 11/2022 revealed LVEF 60-65% with grade 1 diastolic dysfunction.  Mean gradient was 6.8 mmHg across his bicuspid aortic valve.  ROS: ***  Studies Reviewed: .  Coronary CT-A 11/2021: IMPRESSION: 1. Coronary calcium score of 20.2. This was 30 percentile for age and sex matched control.   2. Normal coronary origin with right dominance.   3. Mild Coronary artery disease. CAD-RADS 2. Mild non-obstructive CAD (25-49%). Consider non-atherosclerotic causes of chest pain. Consider preventive therapy and risk factor modification.   4. Proximal ascending aorta aneurysm (40.1 mm).  Echo 11/2022:   1. Left  ventricular ejection fraction, by estimation, is 60 to 65%. Left  ventricular ejection fraction by 3D volume is 59 %. The left ventricle has  normal function. The left ventricle has no regional wall motion  abnormalities. Left ventricular diastolic   parameters are consistent with Grade I diastolic dysfunction (impaired  relaxation).   2. Right ventricular systolic function is normal. The right ventricular  size is normal. There is normal pulmonary artery systolic pressure. The  estimated right ventricular systolic pressure is 24.0 mmHg.   3. The mitral valve is grossly normal. Trivial mitral valve  regurgitation. No evidence of mitral stenosis.   4. Forme fruste bicuspid aortic valve with partial fusion of right and  left coronary cusps. The aortic valve is bicuspid. Aortic valve  regurgitation is moderate. Aortic valve sclerosis/calcification is  present, without any evidence of aortic stenosis.  Aortic valve area, by VTI measures 2.21 cm. Aortic valve mean gradient  measures 6.8 mmHg. Aortic valve Vmax measures 1.60 m/s.   5. Aortic dilatation noted. Aneurysm of the aortic root. There is  moderate dilatation of the aortic root, measuring 46 mm. There is mild  dilatation of the ascending aorta, measuring 43 mm.   6. The inferior vena cava is normal in size with greater than 50%  respiratory variability, suggesting right atrial pressure of 3 mmHg.  Risk Assessment/Calculations:   {Does this patient have ATRIAL FIBRILLATION?:[id number]} No BP recorded.  {Refresh Note OR Click here to enter BP  :1}***       Physical Exam:   VS:  There were no vitals taken for this visit.   Wt Readings from Last 3 Encounters:  12/09/22 173 lb (78.5 kg)  12/24/21 172 lb 4.8 oz (78.2 kg)  11/25/21 174 lb 12.8 oz (79.3 kg)    GEN: Well nourished, well developed in no acute distress NECK: No JVD; No carotid bruits CARDIAC: ***RRR, no murmurs, rubs, gallops RESPIRATORY:  Clear to auscultation without  rales, wheezing or rhonchi  ABDOMEN: Soft, non-tender, non-distended EXTREMITIES:  No edema; No deformity   ASSESSMENT AND PLAN: .   ***    {Are you ordering a CV Procedure (e.g. stress test, cath, DCCV, TEE, etc)?   Press F2        :[id number]}  Dispo: ***  Signed, Chilton Si, MD

## 2023-08-22 NOTE — Patient Instructions (Addendum)
Medication Instructions:  START ASPIRIN 81 MG DAILY   *If you need a refill on your cardiac medications before your next appointment, please call your pharmacy*  Lab Work: FASTING LP/CMET/A1C MID DECEMBER PRIOR TO FOLLOW UP   If you have labs (blood work) drawn today and your tests are completely normal, you will receive your results only by: MyChart Message (if you have MyChart) OR A paper copy in the mail If you have any lab test that is abnormal or we need to change your treatment, we will call you to review the results.  Testing/Procedures: 14 DAY ZIO   Follow-Up: At [facility], you and your health needs are our priority.  As part of our continuing mission to provide you with exceptional heart care, we have created designated Provider Care Teams.  These Care Teams include your primary Cardiologist (physician) and Advanced Practice Providers (APPs -  Physician Assistants and Nurse Practitioners) who all work together to provide you with the care you need, when you need it.  We recommend signing up for the patient portal called "MyChart".  Sign up information is provided on this After Visit Summary.  MyChart is used to connect with patients for Virtual Visits (Telemedicine).  Patients are able to view lab/test results, encounter notes, upcoming appointments, etc.  Non-urgent messages can be sent to your provider as well.   To learn more about what you can do with MyChart, go to [email or web address].    Your next appointment:   2 month(s)  Provider:   Gillian Shields, NP    Other Instructions Christena Deem- Long Term Monitor Instructions  Your physician has requested you wear a ZIO patch monitor for 14 days.  This is a single patch monitor. [employer] supplies one patch monitor per enrollment. Additional stickers are not available. Please do not apply patch if you will be having a Nuclear Stress Test,  Echocardiogram, Cardiac CT, MRI, or Chest Xray during the period you would be  wearing the  monitor. The patch cannot be worn during these tests. You cannot remove and re-apply the  ZIO XT patch monitor.  Your ZIO patch monitor will be mailed 3 day USPS to your address on file. It may take 3-5 days  to receive your monitor after you have been enrolled.  Once you have received your monitor, please review the enclosed instructions. Your monitor  has already been registered assigning a specific monitor serial # to you.  Billing and Patient Assistance Program Information  We have supplied [employer] with any of your insurance information on file for billing purposes. [employer] offers a sliding scale Patient Assistance Program for patients that do not have  insurance, or whose insurance does not completely cover the cost of the ZIO monitor.  You must apply for the Patient Assistance Program to qualify for this discounted rate.  To apply, please call [employer] at [phone], select option 4, select option 2, ask to apply for  Patient Assistance Program. Meredeth Ide will ask your household income, and how many people  are in your household. They will quote your out-of-pocket cost based on that information.  [employer] will also be able to set up a 75-month, interest-free payment plan if needed.  Applying the monitor   Shave hair from upper left chest.  Hold abrader disc by orange tab. Rub abrader in 40 strokes over the upper left chest as  indicated in your monitor instructions.  Clean area with 4 enclosed alcohol pads. Let dry.  Apply patch as  indicated in monitor instructions. Patch will be placed under collarbone on left  side of chest with arrow pointing upward.  Rub patch adhesive wings for 2 minutes. Remove white label marked "1". Remove the white  label marked "2". Rub patch adhesive wings for 2 additional minutes.  While looking in a mirror, press and release button in center of patch. A small green light will  flash 3-4 times. This will be your only indicator that the  monitor has been turned on.  Do not shower for the first 24 hours. You may shower after the first 24 hours.  Press the button if you feel a symptom. You will hear a small click. Record Date, Time and  Symptom in the Patient Logbook.  When you are ready to remove the patch, follow instructions on the last 2 pages of Patient  Logbook. Stick patch monitor onto the last page of Patient Logbook.  Place Patient Logbook in the blue and white box. Use locking tab on box and tape box closed  securely. The blue and white box has prepaid postage on it. Please place it in the mailbox as  soon as possible. Your physician should have your test results approximately 7 days after the  monitor has been mailed back to [facility].  Call [facility] Customer Care at [phone] if you have questions regarding  your ZIO XT patch monitor. Call them immediately if you see an orange light blinking on your  monitor.  If your monitor falls off in less than 4 days, contact our Monitor department at [phone].  If your monitor becomes loose or falls off after 4 days call [employer] at [phone] for  suggestions on securing your monitor

## 2023-08-22 NOTE — Progress Notes (Signed)
Cardiology Office Note:  .    Date:  08/22/2023  ID:  [name], DOB 11-06-1956, MRN [id number] PCP: Ryan Funk, MD (Inactive)  [facility] Providers Cardiologist:  Chilton Si, MD     History of Present Illness: .    Ryan Burton is a 67 y.o. male with a bicuspid aortic valve, mild ascending aorta aneurysm, moderate aortic regurgitation, and Crohn's disease who presents for follow-up.  Ryan Burton saw Ryan Freshwater, NP on 09/07/16 and reported episods of dizzness and chest pain.   He was evaluated in clinic 09/21/16. He was noted to have a murmur on exam. He was referred for an echocardiogram 10/21/16 that revealed LVEF 60-65% with grade 1 diastolic dysfunction. He was also noted to have a functionally bicuspid aortic valve with moderate aortic regurgitation. He also had mild to moderate dilation of the aortic root measuring 4.4 cm.  He had a repeat echo 01/2017 and 03/2018 that were essentially unchanged.  Echo 10/2019 revealed moderate AR and the ascending aorta was 4.0 cm.  He wore a 48 hour Holter that showed occasional PVCs but was otherwise unremarkable.  He also had an ETT 10/2016 that was negative for ischemia.     Ryan Burton blood pressure had been intermittently above goal.  He was typically able to control it with diet and exercise. He had a repeat echocardiogram 11/2020 that was unchanged from prior with moderate aortic regurgitation.  Ascending aorta was 4.0 cm.  He reported a diffuse rash that was discovered to be due to rosuvastatin. After stopping the statin his rash resolved. He also complained of leg cramping and stiffness at times that was unchanged while on the statin. He also developed a rask on atorvastatin.  At his appointment 11/2021 he reported blood pressures that were mostly controlled.  He had nonexertional chest discomfort at times but none with exercise.  Coronary CTA 11/2021 revealed a coronary calcium score of 20.2 which was 30th  percentile.  He had mild disease in the LAD and his ascending aorta remains stable at 4.0 cm. Echo 11/2022 revealed LVEF 60-65% with grade 1 diastolic dysfunction.  Mean gradient was 6.8 mmHg across his bicuspid aortic valve.  Today, he states that about a week ago on Saturday morning (10/5), shortly after waking up he complained of a little bit of chest pressure/tightness and his Apple watch detected possible Afib. Of note, he has felt this kind of chest pressure in the past, which he had previously wondered if related to high blood pressure. That night he had not attained good sleep quality with insomnia. His wife has noted that he occasionally snores, but without apneic episodes. He has since cut back to drinking 1/2 caffeinated coffee. He has tried taking EKG tracings with his smart watch, and uploaded them to MyChart. His tracing from 10/5 does show atrial fibrillation on personal review. He had another tracing that showed sinus rhythm with PAC's. EKG performed today showed sinus bradycardia at 57 bpm. For activity, he [place]. Occasionally he feels lightheaded after bending over to pick up a golf ball but otherwise denies exertional symptoms. He denies any palpitations, shortness of breath, peripheral edema, headaches, syncope, orthopnea, or PND.  ROS:  Please see the history of present illness. All other systems are reviewed and negative.  (+) Chest pressure/tightness (+) Insomnia (+) Occasional snoring (+) Occasional positional lightheadedness  Studies Reviewed: Marland Kitchen   EKG Interpretation Date/Time:  Monday August 22 2023 09:34:33 EDT Ventricular Rate:  57 PR Interval:  176 QRS Duration:  92 QT Interval:  416 QTC Calculation: 404 R Axis:   28  Text Interpretation: Sinus bradycardia Minimal voltage criteria for LVH, may be normal variant ( R in aVL ) No previous ECGs available Confirmed by Chilton Si ([id number]) on 08/22/2023 9:42:37 AM    Echo  12/07/2022: 1.  Left ventricular ejection fraction, by estimation, is 60 to 65%. Left  ventricular ejection fraction by 3D volume is 59 %. The left ventricle has  normal function. The left ventricle has no regional wall motion  abnormalities. Left ventricular diastolic   parameters are consistent with Grade I diastolic dysfunction (impaired  relaxation).   2. Right ventricular systolic function is normal. The right ventricular  size is normal. There is normal pulmonary artery systolic pressure. The  estimated right ventricular systolic pressure is 24.0 mmHg.   3. The mitral valve is grossly normal. Trivial mitral valve  regurgitation. No evidence of mitral stenosis.   4. Forme fruste bicuspid aortic valve with partial fusion of right and  left coronary cusps. The aortic valve is bicuspid. Aortic valve  regurgitation is moderate. Aortic valve sclerosis/calcification is  present, without any evidence of aortic stenosis.  Aortic valve area, by VTI measures 2.21 cm. Aortic valve mean gradient  measures 6.8 mmHg. Aortic valve Vmax measures 1.60 m/s.   5. Aortic dilatation noted. Aneurysm of the aortic root. There is  moderate dilatation of the aortic root, measuring 46 mm. There is mild  dilatation of the ascending aorta, measuring 43 mm.   6. The inferior vena cava is normal in size with greater than 50%  respiratory variability, suggesting right atrial pressure of 3 mmHg.   Risk Assessment/Calculations:    CHA2DS2-VASc Score = 2   This indicates a 2.2% annual risk of stroke. The patient's score is based upon: CHF History: 0 HTN History: 1 Diabetes History: 0 Stroke History: 0 Vascular Disease History: 0 Age Score: 1 Gender Score: 0            Physical Exam:    VS:  BP 124/78 (BP Location: Left Arm, Patient Position: Sitting, Cuff Size: Normal)   Pulse 64   Ht 5\' 10"  (1.778 m)   Wt 171 lb 3.2 oz (77.7 kg)   SpO2 96%   BMI 24.56 kg/m  , BMI Body mass index is 24.56 kg/m. GENERAL:  Well  appearing HEENT: Pupils equal round and reactive, fundi not visualized, oral mucosa unremarkable NECK:  No jugular venous distention, waveform within normal limits, carotid upstroke brisk and symmetric, no bruits, no thyromegaly LUNGS:  Clear to auscultation bilaterally HEART:  RRR.  PMI not displaced or sustained,S1 and S2 within normal limits, no S3, no S4, no clicks, no rubs, 1/6 systolic murmur at LUSB ABD:  Flat, positive bowel sounds normal in frequency in pitch, no bruits, no rebound, no guarding, no midline pulsatile mass, no hepatomegaly, no splenomegaly EXT:  2 plus pulses throughout, no edema, no cyanosis no clubbing SKIN:  No rashes no nodules NEURO:  Cranial nerves II through XII grossly intact, motor grossly intact throughout PSYCH:  Cognitively intact, oriented to person place and time  Wt Readings from Last 3 Encounters:  08/22/23 171 lb 3.2 oz (77.7 kg)  12/09/22 173 lb (78.5 kg)  12/24/21 172 lb 4.8 oz (78.2 kg)     ASSESSMENT AND PLAN: .    # Bicuspid aortic valve:  #Moderate AR:  Stable on echo 11/2022.  No HF symptoms.  Repeat planned for 12/2022.  # Mild ascending aorta aneurysm:  4.0 cm on CT.  Repeat echo pending as above.  Echo readings have been higher than CT in the past.  Not on beta blocker 2/2 bradycardia.   # Paroxysmal Atrial Fibrillation: Detected by Apple Watch with confirmation via EKG. No significant symptoms reported. Discussed the risk of stroke and the need for anticoagulation based on overall risk factors. CHA2DS2-VASC score is 2.  Current risk factors include age and controlled hypertension.  Duration of afib is unclear.   -Plan to wear a 14 day Zio monitor for a couple of weeks after patient returns from travel to better quantify AFib burden. -Start aspirin 81mg  daily given that he also has non-obstructive CAD.  # Hyperlipidemia LDL improved to 73 from 120 on atorvastatin. Patient has made dietary changes. -Continue atorvastatin. -Recheck  fasting lipids, comprehensive metabolic panel, and A1c in December 2024.  # Pre-diabetes Fasting glucose slightly elevated at 102. Patient has made dietary changes. -Check A1c in December 2024.  # Follow-up -Schedule follow-up appointment after labs in December 2024. -Continue with planned follow-up echo in February 2025.         Dispo:  FU with APP in 2 months.   I,Mathew Stumpf,acting as a Neurosurgeon for Chilton Si, MD.,have documented all relevant documentation on the behalf of Chilton Si, MD,as directed by  Chilton Si, MD while in the presence of Chilton Si, MD.  I, [place] C. Duke Salvia, MD have reviewed all documentation for this visit.  The documentation of the exam, diagnosis, procedures, and orders on 08/22/2023 are all accurate and complete.   Signed, Chilton Si, MD

## 2023-08-22 NOTE — Progress Notes (Unsigned)
Enrolled patient for a 14 Day Zio XT monitor to be mailed to patients home around 09/21/23

## 2023-09-14 DIAGNOSIS — I4719 Other supraventricular tachycardia: Secondary | ICD-10-CM | POA: Diagnosis not present

## 2023-09-14 DIAGNOSIS — I493 Ventricular premature depolarization: Secondary | ICD-10-CM

## 2023-09-14 DIAGNOSIS — R002 Palpitations: Secondary | ICD-10-CM

## 2023-09-14 DIAGNOSIS — I480 Paroxysmal atrial fibrillation: Secondary | ICD-10-CM

## 2023-10-13 LAB — COMPREHENSIVE METABOLIC PANEL
ALT: 20 [IU]/L (ref 0–44)
AST: 16 [IU]/L (ref 0–40)
Albumin: 4.5 g/dL (ref 3.9–4.9)
Alkaline Phosphatase: 105 [IU]/L (ref 44–121)
BUN/Creatinine Ratio: 15 (ref 10–24)
BUN: 14 mg/dL (ref 8–27)
Bilirubin Total: 0.4 mg/dL (ref 0.0–1.2)
CO2: 26 mmol/L (ref 20–29)
Calcium: 9.3 mg/dL (ref 8.6–10.2)
Chloride: 105 mmol/L (ref 96–106)
Creatinine, Ser: 0.94 mg/dL (ref 0.76–1.27)
Globulin, Total: 2 g/dL (ref 1.5–4.5)
Glucose: 99 mg/dL (ref 70–99)
Potassium: 4.6 mmol/L (ref 3.5–5.2)
Sodium: 143 mmol/L (ref 134–144)
Total Protein: 6.5 g/dL (ref 6.0–8.5)
eGFR: 88 mL/min/{1.73_m2} (ref >59)

## 2023-10-13 LAB — LIPID PANEL
Chol/HDL Ratio: 3 {ratio} (ref 0.0–5.0)
Cholesterol, Total: 140 mg/dL (ref 100–199)
HDL: 47 mg/dL (ref >39)
LDL Chol Calc (NIH): 78 mg/dL (ref 0–99)
Triglycerides: 73 mg/dL (ref 0–149)
VLDL Cholesterol Cal: 15 mg/dL (ref 5–40)

## 2023-10-13 LAB — HEMOGLOBIN A1C
Est. average glucose Bld gHb Est-mCnc: 114 mg/dL
Hgb A1c MFr Bld: 5.6 % (ref 4.8–5.6)

## 2023-10-28 ENCOUNTER — Encounter (HOSPITAL_BASED_OUTPATIENT_CLINIC_OR_DEPARTMENT_OTHER): Payer: Self-pay | Admitting: *Deleted

## 2023-11-03 ENCOUNTER — Encounter (HOSPITAL_BASED_OUTPATIENT_CLINIC_OR_DEPARTMENT_OTHER): Payer: Self-pay

## 2023-11-08 ENCOUNTER — Encounter (HOSPITAL_BASED_OUTPATIENT_CLINIC_OR_DEPARTMENT_OTHER): Payer: Self-pay | Admitting: Family

## 2023-11-08 ENCOUNTER — Ambulatory Visit (INDEPENDENT_AMBULATORY_CARE_PROVIDER_SITE_OTHER): Payer: PPO | Admitting: Family

## 2023-11-08 VITALS — BP 120/68 | HR 65 | Ht 70.0 in | Wt 175.0 lb

## 2023-11-08 DIAGNOSIS — I480 Paroxysmal atrial fibrillation: Secondary | ICD-10-CM

## 2023-11-08 DIAGNOSIS — I25118 Atherosclerotic heart disease of native coronary artery with other forms of angina pectoris: Secondary | ICD-10-CM | POA: Diagnosis not present

## 2023-11-08 DIAGNOSIS — I471 Supraventricular tachycardia, unspecified: Secondary | ICD-10-CM

## 2023-11-08 DIAGNOSIS — I7121 Aneurysm of the ascending aorta, without rupture: Secondary | ICD-10-CM

## 2023-11-08 DIAGNOSIS — Q2381 Bicuspid aortic valve: Secondary | ICD-10-CM

## 2023-11-08 DIAGNOSIS — E785 Hyperlipidemia, unspecified: Secondary | ICD-10-CM

## 2023-11-08 DIAGNOSIS — I493 Ventricular premature depolarization: Secondary | ICD-10-CM

## 2023-11-08 DIAGNOSIS — I351 Nonrheumatic aortic (valve) insufficiency: Secondary | ICD-10-CM

## 2023-11-08 NOTE — Progress Notes (Signed)
 Cardiology Office Note:  .   Date:  11/08/2023  ID:  [name], DOB March 06, 1956, MRN [id number] PCP: Charlott Dorn LABOR, MD  Ryan Burton Cardiologist:  [name], MD    History of Present Illness: .   [name] is a 67 y.o. male with a hx of mild nonobstructive coronary artery disease, moderate aortic regurgitation, bicuspid aortic valve, ascending aortic dilation, PVC, hypertension, Crohn's.   Established with cardiology in 2017 for chest pain and dizziness.  Due to murmur, echocardiogram ordered revealing LVEF 60 to 65%, grade 1 diastolic dysfunction, functional bicuspid aortic valve with moderate regurgitation, mild to moderate dilation of aortic root 4.4 cm. 48-hour monitor 2017with occasional PVC but otherwise normal sinus rhythm.  ETT 10/2016 negative for ischemia.  Coronary CTA for chest pain 12/02/2021 with coronary calcium  score of 20.2 placing him the 30th percentile for age and sex matched control with mild nonobstructive coronary disease.  Most recent echo 12/07/2022 LVEF 60 to 65%, no RWMA, grade 1 diastolic dysfunction, RV normal size and function, trivial MR, bicuspid aortic valve with moderate AI and no stenosis (mean gradient 6.8 mmHg), moderate dilation aortic root 46 mm, mild dilation ascending aorta 43 mm.  Lasting by Dr. Scarce 08/22/2023.  Apple Watch EKG tracings 10/5 revealed atrial fibrillation.  EKG in clinic sinus bradycardia 57 bpm.  He noted occasional lightheadedness with position changes such as bending over to pick up a golf ball but otherwise feeling well.  14-day ZIO placed with predominantly normal sinus rhythm, 15 runs of SVT up to 16 beats and occasional PVC 1.8% burden but no atrial fibrillation.   Presents today independently. Feeling well since last seen, enjoying spending time with his family. Reports no shortness of breath nor dyspnea on exertion. Reports no chest pain, pressure, or tightness. No edema, orthopnea,  PND. Reports no palpitations.  If he bends over and stands up quickly gets lightheadedness which is unchanged from previous but no near syncope, syncope. We reviewed LDL goal of <70 as last check was 78, prefers lifestyle changes as notes he has not been as active due to busy schedule and previously took higher dose Atorvastatin  and Rosuvastatin  and developed rash and dry spots. No recent palpitations nor arrhythmias by Apple Watch.  ROS: Please see the history of present illness.    All other systems reviewed and are negative.   Studies Reviewed: .        Cardiac Studies & Procedures     STRESS TESTS  EXERCISE TOLERANCE TEST (ETT) 10/21/2016  Narrative  Blood pressure demonstrated a normal response to exercise.  There was no ST segment deviation noted during stress.  Clinically and electrically negative for ischemia  Exclellent exercise capacity  ECHOCARDIOGRAM  ECHOCARDIOGRAM COMPLETE 12/07/2022  Narrative ECHOCARDIOGRAM REPORT    Patient Name:   Ryan Burton Date of Exam: 12/07/2022 Medical Rec #:  [id number]          Height:       70.0 in Accession #:    [id number]         Weight:       172.3 lb Date of Birth:  29-Aug-1956         BSA:          1.959 m Patient Age:    66 years           BP:           136/82 mmHg Patient Gender: M  HR:           56 bpm. Exam Location:  [address]  Procedure: 2D Echo, Cardiac Doppler, Color Doppler, 3D Echo and Strain Analysis  Indications:    Ascending Aorta Aneurysm I71 Bicuspid Aortic Valve  History:        Patient has prior history of Echocardiogram examinations, most recent 12/01/2021.  Sonographer:    Augustin Seals RDCS Referring Phys: [id number] TIFFANY Platte  IMPRESSIONS   1. Left ventricular ejection fraction, by estimation, is 60 to 65%. Left ventricular ejection fraction by 3D volume is 59 %. The left ventricle has normal function. The left ventricle has no regional wall motion abnormalities.  Left ventricular diastolic parameters are consistent with Grade I diastolic dysfunction (impaired relaxation). 2. Right ventricular systolic function is normal. The right ventricular size is normal. There is normal pulmonary artery systolic pressure. The estimated right ventricular systolic pressure is 24.0 mmHg. 3. The mitral valve is grossly normal. Trivial mitral valve regurgitation. No evidence of mitral stenosis. 4. Forme fruste bicuspid aortic valve with partial fusion of right and left coronary cusps. The aortic valve is bicuspid. Aortic valve regurgitation is moderate. Aortic valve sclerosis/calcification is present, without any evidence of aortic stenosis. Aortic valve area, by VTI measures 2.21 cm. Aortic valve mean gradient measures 6.8 mmHg. Aortic valve Vmax measures 1.60 m/s. 5. Aortic dilatation noted. Aneurysm of the aortic root. There is moderate dilatation of the aortic root, measuring 46 mm. There is mild dilatation of the ascending aorta, measuring 43 mm. 6. The inferior vena cava is normal in size with greater than 50% respiratory variability, suggesting right atrial pressure of 3 mmHg.  FINDINGS Left Ventricle: Left ventricular ejection fraction, by estimation, is 60 to 65%. Left ventricular ejection fraction by 3D volume is 59 %. The left ventricle has normal function. The left ventricle has no regional wall motion abnormalities. Global longitudinal strain performed but not reported based on interpreter judgement due to suboptimal tracking. The left ventricular internal cavity size was normal in size. There is no left ventricular hypertrophy. Left ventricular diastolic parameters are consistent with Grade I diastolic dysfunction (impaired relaxation).  Right Ventricle: The right ventricular size is normal. No increase in right ventricular wall thickness. Right ventricular systolic function is normal. There is normal pulmonary artery systolic pressure. The tricuspid regurgitant  velocity is 2.29 m/s, and with an assumed right atrial pressure of 3 mmHg, the estimated right ventricular systolic pressure is 24.0 mmHg.  Left Atrium: Left atrial size was normal in size.  Right Atrium: Right atrial size was normal in size.  Pericardium: There is no evidence of pericardial effusion.  Mitral Valve: The mitral valve is grossly normal. Trivial mitral valve regurgitation. No evidence of mitral valve stenosis.  Tricuspid Valve: The tricuspid valve is normal in structure. Tricuspid valve regurgitation is mild . No evidence of tricuspid stenosis.  Aortic Valve: Forme fruste bicuspid aortic valve with partial fusion of right and left coronary cusps. The aortic valve is bicuspid. Aortic valve regurgitation is moderate. Aortic regurgitation PHT measures 465 msec. Aortic valve sclerosis/calcification is present, without any evidence of aortic stenosis. Aortic valve mean gradient measures 6.8 mmHg. Aortic valve peak gradient measures 10.3 mmHg. Aortic valve area, by VTI measures 2.21 cm.  Pulmonic Valve: The pulmonic valve was normal in structure. Pulmonic valve regurgitation is mild. No evidence of pulmonic stenosis.  Aorta: Aortic dilatation noted. There is moderate dilatation of the aortic root, measuring 46 mm. There is mild dilatation of the  ascending aorta, measuring 43 mm. There is an aneurysm involving the aortic root.  Venous: The inferior vena cava was not well visualized. The inferior vena cava is normal in size with greater than 50% respiratory variability, suggesting right atrial pressure of 3 mmHg.  IAS/Shunts: No atrial level shunt detected by color flow Doppler.   LEFT VENTRICLE PLAX 2D LVIDd:         4.60 cm         Diastology LVIDs:         2.90 cm         LV e' medial:    5.22 cm/s LV PW:         1.00 cm         LV E/e' medial:  10.3 LV IVS:        1.00 cm         LV e' lateral:   10.00 cm/s LVOT diam:     2.40 cm         LV E/e' lateral: 5.4 LV SV:          80 LV SV Index:   41 LVOT Area:     4.52 cm        3D Volume EF LV 3D EF:    Left ventricul ar ejection fraction by 3D volume is 59 %.  3D Volume EF: 3D EF:        59 % LV EDV:       128 ml LV ESV:       52 ml LV SV:        75 ml  RIGHT VENTRICLE RV Basal diam:  3.10 cm RV Mid diam:    3.40 cm RV S prime:     11.10 cm/s TAPSE (M-mode): 1.8 cm  LEFT ATRIUM             Index        RIGHT ATRIUM           Index LA diam:        2.80 cm 1.43 cm/m   RA Area:     16.10 cm LA Vol (A2C):   54.6 ml 27.87 ml/m  RA Volume:   37.10 ml  18.94 ml/m LA Vol (A4C):   52.3 ml 26.69 ml/m LA Biplane Vol: 55.2 ml 28.18 ml/m AORTIC VALVE AV Area (Vmax):    2.22 cm AV Area (Vmean):   1.85 cm AV Area (VTI):     2.21 cm AV Vmax:           160.42 cm/s AV Vmean:          124.507 cm/s AV VTI:            0.359 m AV Peak Grad:      10.3 mmHg AV Mean Grad:      6.8 mmHg LVOT Vmax:         78.60 cm/s LVOT Vmean:        50.800 cm/s LVOT VTI:          0.176 m LVOT/AV VTI ratio: 0.49 AI PHT:            465 msec  AORTA Ao Root diam: 4.60 cm Ao Asc diam:  4.50 cm  MITRAL VALVE               TRICUSPID VALVE MV Area (PHT): 2.54 cm    TR Peak grad:   21.0 mmHg MV Decel Time: 299 msec  TR Vmax:        229.00 cm/s MV E velocity: 54.00 cm/s MV A velocity: 76.50 cm/s  SHUNTS MV E/A ratio:  0.71        Systemic VTI:  0.18 m Systemic Diam: 2.40 cm  Soyla Merck MD Electronically signed by Soyla Merck MD Signature Date/Time: 12/07/2022/10:01:53 PM    Final   MONITORS  LONG TERM MONITOR (3-14 DAYS) 10/11/2023  Narrative 14 Day Zio Monitor  Quality: Fair.  Baseline artifact. Predominant rhythm: sinus rhythm Average heart rate: 65 bpm Max heart rate: 123 bpm Min heart rate: 42 bpm Pauses >2.5 seconds: none  15 runs of SVT lasting up to 16 beats Occasional PVCs (1.8%)  Tiffany C. Raford, MD, [facility] 11/02/2023 9:01 PM  CT SCANS  CT CORONARY MORPH W/CTA COR W/SCORE  12/02/2021  Addendum 12/02/2021  2:10 PM ADDENDUM REPORT: 12/02/2021 14:08  CLINICAL DATA:  This is a 67 year old male with chest pain.  EXAM: Cardiac/Coronary  CTA  TECHNIQUE: The patient was scanned on a [employer].  FINDINGS: A 100 kV prospective scan was triggered in the descending thoracic aorta at 111 HU's. Axial non-contrast 3 mm slices were carried out through the heart. The data set was analyzed on a dedicated work station and scored using the Agatson method. Gantry rotation speed was 250 msecs and collimation was .6 mm. No beta blockade and 0.8 mg of sl NTG was given. The 3D data set was reconstructed in 5% intervals of the 67-82 % of the R-R cycle. Diastolic phases were analyzed on a dedicated work station using MPR, MIP and VRT modes. The patient received 80 cc of contrast.  Aorta: Proximal ascending aorta is aneurysmal (40.1 mm). No calcifications. No dissection.  Aortic Valve:  Trileaflet.  No calcifications.  Coronary Arteries:  Normal coronary origin.  Right dominance.  RCA is a large dominant artery that gives rise to PDA and PLA. There is no plaque.  Left main is a large artery that gives rise to LAD and LCX arteries.  LAD is a large vessel. The LAD with no plaques. Mild (25-49%) calcification at the ostium of the D1 vessel.  LCX is a non-dominant artery that gives rise to one large OM1 branch. There is no plaque.  Coronary Calcium  Score:  Left main: 0  Left anterior descending artery: 17.7  Left circumflex artery: 0  Right coronary artery: 2.49  Total: 20.2  Percentile: 30  Other findings:  Normal pulmonary vein drainage into the left atrium.  Normal left atrial appendage without a thrombus.  Normal size of the pulmonary artery.  IMPRESSION: 1. Coronary calcium  score of 20.2. This was 30 percentile for age and sex matched control.  2. Normal coronary origin with right dominance.  3. Mild Coronary artery disease.  CAD-RADS 2. Mild non-obstructive CAD (25-49%). Consider non-atherosclerotic causes of chest pain. Consider preventive therapy and risk factor modification.  4. Proximal ascending aorta aneurysm (40.1 mm).   Electronically Signed By: Kardie  Tobb D.O. On: 12/02/2021 14:08  Narrative EXAM: OVER-READ INTERPRETATION  CT CHEST  The following report is an over-read performed by radiologist Dr. Toribio Aye of [facility] on 12/02/2021. This over-read does not include interpretation of cardiac or coronary anatomy or pathology. The coronary calcium  score/coronary CTA interpretation by the cardiologist is attached.  COMPARISON:  None.  FINDINGS: Extracardiac findings will be described separately under dictation for contemporaneously obtained chest CTA.  IMPRESSION: Please see separate dictation for contemporaneously obtained chest CTA dated  12/02/2021 for full description of relevant extracardiac findings.  Electronically Signed: By: Toribio Aye M.D. On: 12/02/2021 09:46          Risk Assessment/Calculations:    CHA2DS2-VASc Score = 3   This indicates a 3.2% annual risk of stroke. The patient's score is based upon: CHF History: 0 HTN History: 1 Diabetes History: 0 Stroke History: 0 Vascular Disease History: 1 (coronary calcification on CT) Age Score: 1 Gender Score: 0            Physical Exam:   VS:  BP 120/68   Pulse 65   Ht 5' 10 (1.778 m)   Wt 175 lb (79.4 kg)   SpO2 95%   BMI 25.11 kg/m    Wt Readings from Last 3 Encounters:  11/08/23 175 lb (79.4 kg)  08/22/23 171 lb 3.2 oz (77.7 kg)  12/09/22 173 lb (78.5 kg)    GEN: Well nourished, well developed in no acute distress NECK: No JVD; No carotid bruits CARDIAC: RRR, no murmurs, rubs, gallops RESPIRATORY:  Clear to auscultation without rales, wheezing or rhonchi  ABDOMEN: Soft, non-tender, non-distended EXTREMITIES:  No edema; No deformity   ASSESSMENT AND PLAN: .    CAD/HLD,  LDL goal within 70- Stable with no anginal symptoms. No indication for ischemic evaluation.  GDMT aspirin, atorvastatin , Zetia .  No beta-blocker due to bradycardia.  10/12/2023 LDL 78.  Previously did not tolerate higher doses of atorvastatin  or rosuvastatin .  Continue atorvastatin  5 mg daily, Zetia  10 mg daily.  Will proceed with lifestyle changes.  Discussed increasing fiber intake, increasing physical activity, and handouts given. Repeat lipid panel prior to next OV.   HTN - BP well controlled. Continue current antihypertensive regimen.    PAF/PVC/SVT- Apple Watch EKG 08/13/23 (in Mychart encounter from that date) consistent with PAF. 2 week  monitor 08/22/23 with no PAF but short runs of SVT which were asymptomatic. Discussed with Dr. Raford. CHA2DS2-VASc Score = 3 [CHF History: 0, HTN History: 1, Diabetes History: 0, Stroke History: 0, Vascular Disease History: 1 (coronary calcification on CT), Age Score: 1, Gender Score: 0].  Therefore, the patient's annual risk of stroke is 3.2 %.    Plan for referral to EP for consideration of loop recorder prior to addition of OAC given on PAF episode is per Apple Watch.  Mild ascending aortic aneurysm- 4.0 cm on CT.  Repeat echo pending 12/2023.  Continue optimal BP control.  No beta-blocker due to bradycardia.  Bicuspid AV / Moderate AI - Repeat echo scheduled 12/2023 for serial monitoring. No concerning symptoms.        Dispo: follow up with Dr. Raford 03/2024 with lipid panel one week prior  Signed, Reche GORMAN Finder, NP

## 2023-11-08 NOTE — Patient Instructions (Addendum)
 Medication Instructions:  Your physician recommends that you continue on your current medications as directed. Please refer to the Current Medication list given to you today.  *If you need a refill on your cardiac medications before your next appointment, please call your pharmacy*   Lab Work: Your provider recommends the following labs: Lipid panel (ONE WEEK PRIOR TO NEXT APPOINTMENT)  If you have labs (blood work) drawn today and your tests are completely normal, you will receive your results only by: MyChart Message (if you have MyChart) OR A paper copy in the mail If you have any lab test that is abnormal or we need to change your treatment, we will call you to review the results.  Follow-Up: At [facility], you and your health needs are our priority.  As part of our continuing mission to provide you with exceptional heart care, we have created designated Provider Care Teams.  These Care Teams include your primary Cardiologist (physician) and Advanced Practice Providers (APPs -  Physician Assistants and Nurse Practitioners) who all work together to provide you with the care you need, when you need it.  We recommend signing up for the patient portal called MyChart.  Sign up information is provided on this After Visit Summary.  MyChart is used to connect with patients for Virtual Visits (Telemedicine).  Patients are able to view lab/test results, encounter notes, upcoming appointments, etc.  Non-urgent messages can be sent to your provider as well.   To learn more about what you can do with MyChart, go to [email or web address].    Your next appointment:   May 2025  Provider:   [name], MD

## 2023-12-04 NOTE — Progress Notes (Addendum)
 Electrophysiology Office Note:    Date:  12/05/2023   ID:  Ryan Burton, DOB [birth year], MRN [id number]  PCP:  Ryan Aspen, MD   [facility] Providers Cardiologist:  Ryan Si, MD     Referring MD: Ryan Sorrow, NP   History of Present Illness:    Ryan Burton is a 68 y.o. male with a medical history significant for PVCs, nonobstructive coronary disease, bicuspid aortic valve with moderate regurgitation, hypertension, Crohn's disease, referred for management of atrial fibrillation.     Patient received Apple Watch alerts for atrial fibrillation.  Strips from Apple Watch are scanned in and listed under MyChart notification from October 5.  The tracing does show atrial fibrillation.  A 2-week monitor was placed in October 2024 that showed short runs of SVT but no definitive atrial fibrillation.  I discussed the use of AI scribe software for clinical note transcription with the patient, who gave verbal consent to proceed.  was referred for evaluation of atrial fibrillation (AFib) detected by their Apple Watch. The patient reports no symptoms associated with AFib, such as palpitations, shortness of breath, or fatigue. The AFib was detected in October and the patient wore a monitor for two weeks, which showed episodes of supraventricular tachycardia (SVT). The patient does not typically wear the Apple Watch at night, which is when the AFib was detected. The patient's mother, who is 71 years old, has a history of AFib and has undergone ablation and cardioversion.     Today, he reports that he is doing very well. He has no complaints. He notes that he does snore, according to his wife.  He does not have daytime sleepiness.  EKGs/Labs/Other Studies Reviewed Today:     Echocardiogram:  TTE December 07, 2022 EF 60 to 65%.  Bicuspid aortic valve.  Moderate aortic regurgitation   Monitors:  14 day monitor November 2024-- my interpretation Sinus  rhythm 42 to 123 bpm, average 65 15 brief atrial runs, possible pulmonary vein tachycardia Occasional PVCs, 1.8% burden Patient triggered episodes correlated with occasional PVCs   EKG:   EKG Interpretation Date/Time:  Monday December 05 2023 08:30:22 EST Ventricular Rate:  56 PR Interval:  186 QRS Duration:  96 QT Interval:  432 QTC Calculation: 416 R Axis:   37  Text Interpretation: Sinus bradycardia When compared with ECG of 22-Aug-2023 09:34, Minimal criteria for Anterior infarct are no longer Present T wave inversion no longer evident in Inferior leads Confirmed by Ryan Burton [phone]) on 12/05/2023 8:41:21 AM     Physical Exam:    VS:  BP 130/88 (BP Location: Left Arm, Patient Position: Sitting, Cuff Size: Normal)   Pulse (!) 56   Ht 5\' 10"  (1.778 m)   Wt 177 lb 9.6 oz (80.6 kg)   SpO2 97%   BMI 25.48 kg/m     Wt Readings from Last 3 Encounters:  12/05/23 177 lb 9.6 oz (80.6 kg)  11/08/23 175 lb (79.4 kg)  08/22/23 171 lb 3.2 oz (77.7 kg)     GEN: Well nourished, well developed in no acute distress CARDIAC: RRR, no murmurs, rubs, gallops RESPIRATORY:  Normal work of breathing MUSCULOSKELETAL: no edema    ASSESSMENT & PLAN:     Atrial fibrillation -- single documented episode Documented on Apple Watch tracing on October 5 (See MyChart notification from 08/13/2023) Suspected pulmonary vein tach episodes on 14-day monitor He is asymptomatic We discussed management options at length including an early intervention given that he  has had documented A-fib and what appeared to be pulmonary tachycardia, placement of a loop monitor for more rigorous monitoring, or continued monitoring with his Apple watch. He would prefer the middle approach --placement of an ILR.  I described the ILR procedure with risk including minor infection minor bleeding.  I explained the monthly monitoring fee.  He would like to proceed.  Secondary hypercoagulable state CHA2DS2-VASc score  is 3: He is not had documentation of recurrent atrial fibrillation, just an episode of SCAF lasting < 24 hours I do not see a strong indication for anticoagulation at this time Will continue to monitor with ILR  Nonobstructive coronary disease On atorvastatin 5 mg, aspirin 81 mg  Possible sleep apnea Sleep study ordered    Signed, Ryan Small, MD  12/05/2023 9:14 AM    [facility]

## 2023-12-05 ENCOUNTER — Encounter: Payer: Self-pay | Admitting: Cardiovascular Disease

## 2023-12-05 ENCOUNTER — Telehealth: Payer: Self-pay | Admitting: *Deleted

## 2023-12-05 ENCOUNTER — Ambulatory Visit: Payer: PPO | Attending: Cardiovascular Disease | Admitting: Cardiovascular Disease

## 2023-12-05 DIAGNOSIS — I480 Paroxysmal atrial fibrillation: Secondary | ICD-10-CM

## 2023-12-05 DIAGNOSIS — I493 Ventricular premature depolarization: Secondary | ICD-10-CM | POA: Diagnosis not present

## 2023-12-05 DIAGNOSIS — I471 Supraventricular tachycardia, unspecified: Secondary | ICD-10-CM

## 2023-12-05 DIAGNOSIS — I4719 Other supraventricular tachycardia: Secondary | ICD-10-CM

## 2023-12-05 NOTE — Patient Instructions (Addendum)
Medication Instructions:  Your physician recommends that you continue on your current medications as directed. Please refer to the Current Medication list given to you today. *If you need a refill on your cardiac medications before your next appointment, please call your pharmacy*   Testing/Procedures: Implantable Loop Recorder  Itamar Sleep Study Your physician has recommended that you have a sleep study. This test records several body functions during sleep, including: brain activity, eye movement, oxygen and carbon dioxide blood levels, heart rate and rhythm, breathing rate and rhythm, the flow of air through your mouth and nose, snoring, body muscle movements, and chest and belly movement.   Follow-Up: At [facility], you and your health needs are our priority.  As part of our continuing mission to provide you with exceptional heart care, we have created designated Provider Care Teams.  These Care Teams include your primary Cardiologist (physician) and Advanced Practice Providers (APPs -  Physician Assistants and Nurse Practitioners) who all work together to provide you with the care you need, when you need it.  We recommend signing up for the patient portal called "MyChart".  Sign up information is provided on this After Visit Summary.  MyChart is used to connect with patients for Virtual Visits (Telemedicine).  Patients are able to view lab/test results, encounter notes, upcoming appointments, etc.  Non-urgent messages can be sent to your provider as well.   To learn more about what you can do with MyChart, go to [email or web address].    Your next appointment:   We will contact you to set up an appointment for loop recorder implant  Provider:   York Pellant, MD

## 2023-12-05 NOTE — Telephone Encounter (Signed)
DR. Nelly Laurence ORDERED ITAMAR STUDY.   Patient agreement reviewed and signed on 12/05/2023.  WatchPAT issued to patient on 12/05/2023 by Danielle Rankin, CMA. Patient aware to not open the WatchPAT box until contacted with the activation PIN. Patient profile initialized in CloudPAT on 12/05/2023 by Danielle Rankin, CMA. Device serial number: [id number]  Please list Reason for Call as Advice Only and type "WatchPAT issued to patient" in the comment box.

## 2023-12-08 ENCOUNTER — Other Ambulatory Visit (HOSPITAL_BASED_OUTPATIENT_CLINIC_OR_DEPARTMENT_OTHER): Payer: Self-pay | Admitting: Cardiovascular Disease

## 2023-12-08 ENCOUNTER — Other Ambulatory Visit (HOSPITAL_BASED_OUTPATIENT_CLINIC_OR_DEPARTMENT_OTHER): Payer: Self-pay | Admitting: Family

## 2023-12-12 ENCOUNTER — Encounter: Payer: Self-pay | Admitting: Cardiovascular Disease

## 2023-12-20 ENCOUNTER — Telehealth: Payer: Self-pay

## 2023-12-20 NOTE — Telephone Encounter (Signed)
Spoke with patient, waiting on insurance approval as far as I'm aware of. Will reach back out to sleep studies team. Patient understands and thankful for the returned call

## 2023-12-28 ENCOUNTER — Encounter (HOSPITAL_BASED_OUTPATIENT_CLINIC_OR_DEPARTMENT_OTHER): Payer: Self-pay

## 2023-12-28 ENCOUNTER — Ambulatory Visit (HOSPITAL_COMMUNITY): Payer: PPO | Attending: Family

## 2023-12-28 DIAGNOSIS — I25118 Atherosclerotic heart disease of native coronary artery with other forms of angina pectoris: Secondary | ICD-10-CM | POA: Diagnosis present

## 2023-12-28 DIAGNOSIS — I10 Essential (primary) hypertension: Secondary | ICD-10-CM | POA: Insufficient documentation

## 2023-12-28 DIAGNOSIS — E785 Hyperlipidemia, unspecified: Secondary | ICD-10-CM | POA: Diagnosis present

## 2023-12-28 LAB — ECHOCARDIOGRAM COMPLETE
AR max vel: 2.35 cm2
AV Area VTI: 2.45 cm2
AV Area mean vel: 2.27 cm2
AV Mean grad: 5.5 mm[Hg]
AV Peak grad: 10 mm[Hg]
Ao pk vel: 1.59 m/s
Area-P 1/2: 1.81 cm2
P 1/2 time: 756 ms
S' Lateral: 3.1 cm

## 2023-12-30 ENCOUNTER — Telehealth: Payer: Self-pay

## 2023-12-30 NOTE — Telephone Encounter (Signed)
 Sleep study approval letter sent to the Sleep fax team.

## 2023-12-30 NOTE — Telephone Encounter (Signed)
**Note De-Identified Avis Tirone Obfuscation** I started a Itamar-HST PA through the [employer] provider portal. Outpatient Authorization [id number] review.

## 2024-01-03 NOTE — Telephone Encounter (Signed)
**Note De-Identified Paytience Bures Obfuscation** Ordering provider: Dr Nelly Laurence Associated diagnoses: PAF-I48.0 WatchPAT PA obtained on 01/03/2024 by Korinna Tat, Lorelle Formosa, LPN. Authorization: Per letter received Raynah Gomes fax from [employer], they have approved coverage of the pts Itamar-HST from 12/30/2023-5/22-2025. Auth #: [id number] Patient notified of PIN ([phone]) on 01/03/2024 Kein Carlberg Notification Method: phone.  Phone note routed to covering staff for follow-up.

## 2024-01-09 ENCOUNTER — Ambulatory Visit: Payer: PPO | Admitting: Cardiovascular Disease

## 2024-01-12 NOTE — Telephone Encounter (Signed)
 Spoke with patient, made aware of water restrictions after ILR implant but may return to normal activity otherwise.

## 2024-01-16 ENCOUNTER — Encounter: Payer: Self-pay | Admitting: Cardiovascular Disease

## 2024-01-16 ENCOUNTER — Ambulatory Visit: Attending: Cardiovascular Disease | Admitting: Cardiovascular Disease

## 2024-01-16 VITALS — BP 120/76 | HR 58 | Ht 70.0 in | Wt 176.0 lb

## 2024-01-16 DIAGNOSIS — I493 Ventricular premature depolarization: Secondary | ICD-10-CM

## 2024-01-16 DIAGNOSIS — I480 Paroxysmal atrial fibrillation: Secondary | ICD-10-CM

## 2024-01-16 DIAGNOSIS — I4719 Other supraventricular tachycardia: Secondary | ICD-10-CM

## 2024-01-16 DIAGNOSIS — R002 Palpitations: Secondary | ICD-10-CM

## 2024-01-16 DIAGNOSIS — I4891 Unspecified atrial fibrillation: Secondary | ICD-10-CM

## 2024-01-16 DIAGNOSIS — I471 Supraventricular tachycardia, unspecified: Secondary | ICD-10-CM

## 2024-01-16 NOTE — Progress Notes (Signed)
 Electrophysiology Office Note:    Date:  01/16/2024   ID:  Ryan Burton, DOB [birth year], MRN [id number]  PCP:  Emilio Aspen, MD   [facility] Providers Cardiologist:  Chilton Si, MD Electrophysiologist:  Maurice Small, MD     Referring MD: Emilio Aspen, *   History of Present Illness:    Ryan Burton is a 68 y.o. male with a medical history significant for PVCs, nonobstructive coronary disease, bicuspid aortic valve with moderate regurgitation, hypertension, Crohn's disease, referred for management of atrial fibrillation.     Patient received Apple Watch alerts for atrial fibrillation.  Strips from Apple Watch are scanned in and listed under MyChart notification from October 5.  The tracing does show atrial fibrillation.  A 2-week monitor was placed in October 2024 that showed short runs of SVT but no definitive atrial fibrillation.  I discussed the use of AI scribe software for clinical note transcription with the patient, who gave verbal consent to proceed.  was referred for evaluation of atrial fibrillation (AFib) detected by their Apple Watch. The patient reports no symptoms associated with AFib, such as palpitations, shortness of breath, or fatigue. The AFib was detected in October and the patient wore a monitor for two weeks, which showed episodes of supraventricular tachycardia (SVT). The patient does not typically wear the Apple Watch at night, which is when the AFib was detected. The patient's mother, who is 70 years old, has a history of AFib and has undergone ablation and cardioversion.     Today, he reports that he is doing very well. He presents for loop recorder placement.  EKGs/Labs/Other Studies Reviewed Today:     Echocardiogram:  TTE December 07, 2022 EF 60 to 65%.  Bicuspid aortic valve.  Moderate aortic regurgitation   Monitors:  14 day monitor November 2024-- my interpretation Sinus rhythm 42 to 123 bpm,  average 65 15 brief atrial runs, possible pulmonary vein tachycardia Occasional PVCs, 1.8% burden Patient triggered episodes correlated with occasional PVCs   EKG:   EKG Interpretation Date/Time:  Monday January 16 2024 16:28:29 EDT Ventricular Rate:  58 PR Interval:  190 QRS Duration:  94 QT Interval:  444 QTC Calculation: 435 R Axis:   29  Text Interpretation: Sinus bradycardia with occasional Premature ventricular complexes When compared with ECG of 05-Dec-2023 08:30, Premature ventricular complexes are now Present Confirmed by York Pellant [phone]) on 01/16/2024 4:45:41 PM     Physical Exam:    VS:  BP 120/76 (BP Location: Left Arm, Patient Position: Sitting, Cuff Size: Normal)   Pulse (!) 58   Ht 5\' 10"  (1.778 m)   Wt 176 lb (79.8 kg)   SpO2 97%   BMI 25.25 kg/m     Wt Readings from Last 3 Encounters:  01/16/24 176 lb (79.8 kg)  12/05/23 177 lb 9.6 oz (80.6 kg)  11/08/23 175 lb (79.4 kg)     GEN: Well nourished, well developed in no acute distress CARDIAC: RRR, no murmurs, rubs, gallops RESPIRATORY:  Normal work of breathing MUSCULOSKELETAL: no edema    ASSESSMENT & PLAN:     Atrial fibrillation -- single documented episode Documented on Apple Watch tracing on October 5 (See MyChart notification from 08/13/2023) Suspected pulmonary vein tach episodes on 14-day monitor He is asymptomatic He is here today for placement of an implantable loop recorder.   Secondary hypercoagulable state CHA2DS2-VASc score is 3: He is not had documentation of recurrent atrial fibrillation, just an episode of  SCAF lasting < 24 hours I do not see a strong indication for anticoagulation at this time Will continue to monitor with ILR  Nonobstructive coronary disease On atorvastatin 5 mg, aspirin 81 mg  Possible sleep apnea Sleep study ordered    Signed, Maurice Small, MD  01/16/2024 5:06 PM    [facility]     PREPROCEDURE DIAGNOSIS:  Atrial  fibrillation    POSTPROCEDURE DIAGNOSIS: Atrial fibrillation     PROCEDURES:   1. Implantable loop recorder implantation    INTRODUCTION:  [name] presents with a history of atrial fibrillation The costs of loop recorder monitoring have been discussed with the patient.    DESCRIPTION OF PROCEDURE:  Informed written consent was obtained.  A timeout was performed. The patient required no sedation for the procedure today. The patients left chest was prepped and draped in the usual sterile fashion. The skin overlying the left parasternal region was infiltrated with lidocaine for local analgesia.  A 0.5-cm incision was made over the left parasternal region over the 3rd intercostal space.  A subcutaneous ILR pocket was fashioned using a combination of sharp and blunt dissection.  A Medtronic Reveal LINQ 2 implantable loop recorder was then placed into the pocket  R waves were very prominent and measured >0.31mV.  Steri- Strips and a sterile dressing were then applied.  There were no early apparent complications.     CONCLUSIONS:   1. Successful implantation of a implantable loop recorder Medtronic [id number]) for a history of atrial fibrillation  2. No early apparent complications.   Maurice Small, MD  Cardiac Electrophysiology

## 2024-01-16 NOTE — Patient Instructions (Signed)
Medication Instructions:  Your physician recommends that you continue on your current medications as directed. Please refer to the Current Medication list given to you today.  Labwork: None ordered.  Testing/Procedures: None ordered.  Follow-Up:  Your physician wants you to follow-up in: one year with Dr. Mealor.  You will receive a reminder letter in the mail two months in advance. If you don't receive a letter, please call our office to schedule the follow-up appointment.    Implantable Loop Recorder Placement, Care After This sheet gives you information about how to care for yourself after your procedure. Your health care provider may also give you more specific instructions. If you have problems or questions, contact your health care provider. What can I expect after the procedure? After the procedure, it is common to have: Soreness or discomfort near the incision. Some swelling or bruising near the incision.  Follow these instructions at home: Incision care  Monitor your cardiac device site for redness, swelling, and drainage. Call the device clinic at [phone] if you experience these symptoms or fever/chills.  Keep the large square bandage on your site for 24 hours and then you may remove it yourself. Keep the steri-strips underneath in place.   You may shower after 72 hours / 3 days from your procedure with the steri-strips in place. They will usually fall off on their own, or may be removed after 10 days. Pat dry.   Avoid lotions, ointments, or perfumes over your incision until it is well-healed.  Please do not submerge in water until your site is completely healed.   Your device is MRI compatible.   Remote monitoring is used to monitor your cardiac device from home. This monitoring is scheduled every month by our office. It allows us to keep an eye on the function of your device to ensure it is working properly.  If your wound site starts to bleed apply pressure.     For help with the monitor please call Medtronic Monitor Support Specialist directly at [phone].    If you have any questions/concerns please call the device clinic at [phone].  Activity  Return to your normal activities.  General instructions Follow instructions from your health care provider about how to manage your implantable loop recorder and transmit the information. Learn how to activate a recording if this is necessary for your type of device. You may go through a metal detection gate, and you may let someone hold a metal detector over your chest. Show your ID card if needed. Do not have an MRI unless you check with your health care provider first. Take over-the-counter and prescription medicines only as told by your health care provider. Keep all follow-up visits as told by your health care provider. This is important. Contact a health care provider if: You have redness, swelling, or pain around your incision. You have a fever. You have pain that is not relieved by your pain medicine. You have triggered your device because of fainting (syncope) or because of a heartbeat that feels like it is racing, slow, fluttering, or skipping (palpitations). Get help right away if you have: Chest pain. Difficulty breathing. Summary After the procedure, it is common to have soreness or discomfort near the incision. Change your dressing as told by your health care provider. Follow instructions from your health care provider about how to manage your implantable loop recorder and transmit the information. Keep all follow-up visits as told by your health care provider. This is important. This information   is not intended to replace advice given to you by your health care provider. Make sure you discuss any questions you have with your health care provider. Document Released: 10/06/2015 Document Revised: 12/10/2017 Document Reviewed: 12/10/2017 Elsevier Patient Education  2020 [employer]  

## 2024-02-21 ENCOUNTER — Ambulatory Visit

## 2024-02-21 DIAGNOSIS — I471 Supraventricular tachycardia, unspecified: Secondary | ICD-10-CM | POA: Diagnosis not present

## 2024-02-23 LAB — CUP PACEART REMOTE DEVICE CHECK
Date Time Interrogation Session: 20250415162111
Implantable Pulse Generator Implant Date: 20250310

## 2024-03-01 ENCOUNTER — Encounter (HOSPITAL_BASED_OUTPATIENT_CLINIC_OR_DEPARTMENT_OTHER): Payer: Self-pay

## 2024-03-01 LAB — LIPID PANEL
Chol/HDL Ratio: 3.1 ratio (ref 0.0–5.0)
Cholesterol, Total: 143 mg/dL (ref 100–199)
HDL: 46 mg/dL (ref >39)
LDL Chol Calc (NIH): 78 mg/dL (ref 0–99)
Triglycerides: 100 mg/dL (ref 0–149)
VLDL Cholesterol Cal: 19 mg/dL (ref 5–40)

## 2024-03-06 ENCOUNTER — Encounter: Payer: Self-pay | Admitting: Cardiovascular Disease

## 2024-03-12 ENCOUNTER — Encounter (HOSPITAL_BASED_OUTPATIENT_CLINIC_OR_DEPARTMENT_OTHER): Payer: Self-pay

## 2024-03-14 ENCOUNTER — Ambulatory Visit (HOSPITAL_BASED_OUTPATIENT_CLINIC_OR_DEPARTMENT_OTHER): Payer: PPO | Admitting: Cardiovascular Disease

## 2024-03-14 ENCOUNTER — Encounter (HOSPITAL_BASED_OUTPATIENT_CLINIC_OR_DEPARTMENT_OTHER): Payer: Self-pay | Admitting: Cardiovascular Disease

## 2024-03-14 VITALS — BP 136/72 | HR 57 | Ht 70.0 in | Wt 175.6 lb

## 2024-03-14 DIAGNOSIS — E7800 Pure hypercholesterolemia, unspecified: Secondary | ICD-10-CM

## 2024-03-14 DIAGNOSIS — I10 Essential (primary) hypertension: Secondary | ICD-10-CM | POA: Diagnosis not present

## 2024-03-14 DIAGNOSIS — I351 Nonrheumatic aortic (valve) insufficiency: Secondary | ICD-10-CM

## 2024-03-14 DIAGNOSIS — Q2381 Bicuspid aortic valve: Secondary | ICD-10-CM | POA: Diagnosis not present

## 2024-03-14 DIAGNOSIS — I7121 Aneurysm of the ascending aorta, without rupture: Secondary | ICD-10-CM

## 2024-03-14 DIAGNOSIS — I493 Ventricular premature depolarization: Secondary | ICD-10-CM

## 2024-03-14 DIAGNOSIS — I480 Paroxysmal atrial fibrillation: Secondary | ICD-10-CM

## 2024-03-14 MED ORDER — METOPROLOL TARTRATE 25 MG PO TABS: ORAL_TABLET | ORAL | 0 refills | Status: DC

## 2024-03-14 MED ORDER — DIPHENHYDRAMINE HCL 50 MG PO TABS: ORAL_TABLET | ORAL | 0 refills | Status: DC

## 2024-03-14 NOTE — Progress Notes (Signed)
 Cardiology Office Note:  .    Date:  03/14/2024  ID:  Ryan Burton, DOB Mar 23, 1956, MRN [id number] PCP: Benedetta Bradley, MD  [facility] Providers Cardiologist:  Maudine Sos, MD Electrophysiologist:  Efraim Grange, MD     History of Present Illness: .   Ryan Burton is a 68 y.o. male with a bicuspid aortic valve, mild ascending aorta aneurysm, moderate aortic regurgitation, non-obstructive CAD, and Crohn's disease who presents for follow-up.  Ryan Burton saw Lylia Sand, NP on 09/07/16 and reported episods of dizzness and chest pain.   He was evaluated in clinic 09/21/16. He was noted to have a murmur on exam. He was referred for an echocardiogram 10/21/16 that revealed LVEF 60-65% with grade 1 diastolic dysfunction. He was also noted to have a functionally bicuspid aortic valve with moderate aortic regurgitation. He also had mild to moderate dilation of the aortic root measuring 4.4 cm.  He had a repeat echo 01/2017 and 03/2018 that were essentially unchanged.  Echo 10/2019 revealed moderate AR and the ascending aorta was 4.0 cm.  He wore a 48 hour Holter that showed occasional PVCs but was otherwise unremarkable.  He also had an ETT 10/2016 that was negative for ischemia.     Ryan Burton had been intermittently above goal.  He was typically able to control it with diet and exercise. He had a repeat echocardiogram 11/2020 that was unchanged from prior with moderate aortic regurgitation.  Ascending aorta was 4.0 cm.  He reported a diffuse rash that was discovered to be due to rosuvastatin . After stopping the statin his rash resolved. He also complained of leg cramping and stiffness at times that was unchanged while on the statin. He also developed a rask on atorvastatin .  At his appointment 11/2021 he reported blood pressures that were mostly controlled.  He had nonexertional chest discomfort at times but none with exercise.  Coronary CTA 11/2021  revealed a coronary calcium  score of 20.2 which was 30th percentile.  He had mild disease in the LAD and his ascending aorta remains stable at 4.0 cm. Echo 11/2022 revealed LVEF 60-65% with grade 1 diastolic dysfunction.  Mean gradient was 6.8 mmHg across his bicuspid aortic valve.   He was seen 08/2023 with concern for new onset atrial fibrillation detected on his Apple watch.  He wore a 14 day Zio which showed 15 runs of SVT and occasional PVCs.  He saw Dr. Arlester Ladd who felt that it was likely pulmonary vein tachycardia.  An ILR was implanted.  Echo 12/2023 revealed moderate to severe AR.  The aortic root was 4.6 mm and ascending aorta 42 mm.     Discussed the use of AI scribe software for clinical note transcription with the patient, who gave verbal consent to proceed.  History of Present Illness Ryan Burton feels generally well and remains active by walking the golf course. He experiences dyspnea on exertion, particularly on steep inclines, but does not require oxygen or need to stop. No chest pain or Burton during these activities.  He has hypertension but has not been regularly checking his blood Burton at home due to his puppy chewing on the equipment. His blood Burton was slightly elevated at 136/88 mmHg during the visit.  He was previously noted to be borderline prediabetic. In response, he cut out his evening ice cream, although he did not notice a significant change in his blood sugar levels. His last recorded HbA1c was 5.6%.  He is on  atorvastatin , taking 5 mg daily, and Zetia  for cholesterol management. He has not started aspirin therapy as his calcium  score is under 100, specifically 20.  He had a loop recorder inserted after an external monitor showed some fast rhythms but no atrial fibrillation. He is not on long-term anticoagulation.  ROS:  As per HPI  Studies Reviewed: .       Echo 12/28/23:  1. Left ventricular ejection fraction, by estimation, is 60 to 65%. Left   ventricular ejection fraction by 3D volume is 60 %. The left ventricle has  normal function. The left ventricle has no regional wall motion  abnormalities. Left ventricular diastolic   parameters are consistent with Grade I diastolic dysfunction (impaired  relaxation). The average left ventricular global longitudinal strain is  -25.2 %. The global longitudinal strain is normal.   2. Right ventricular systolic function is normal. The right ventricular  size is normal. There is normal pulmonary artery systolic Burton. The  estimated right ventricular systolic Burton is 29.6 mmHg.   3. The mitral valve is grossly normal. Trivial mitral valve  regurgitation. No evidence of mitral stenosis.   4. Forme fruste bicuspid aortic valve with partial fusion of right and  left coronary cusps. The aortic valve is bicuspid. There is mild  calcification of the aortic valve. Aortic valve regurgitation is moderate  to severe. Aortic valve  sclerosis/calcification is present, without any evidence of aortic  stenosis.   5. Aortic dilatation noted. Aneurysm of the aortic root, measuring 46 mm.  There is moderate dilatation of the aortic root. There is mild dilatation  of the ascending aorta, measuring 42 mm.   6. The inferior vena cava is normal in size with greater than 50%  respiratory variability, suggesting right atrial Burton of 3 mmHg.   Coronary CT-A 11/2023: IMPRESSION: 1. Coronary calcium  score of 20.2. This was 30 percentile for age and sex matched control.   2. Normal coronary origin with right dominance.   3. Mild Coronary artery disease. CAD-RADS 2. Mild non-obstructive CAD (25-49%). Consider non-atherosclerotic causes of chest pain. Consider preventive therapy and risk factor modification.   4. Proximal ascending aorta aneurysm (40.1 mm).  Risk Assessment/Calculations:         STOP-Bang Score:  4      Physical Exam:   VS:  BP 136/72 (BP Location: Left Arm, Patient Position:  Sitting, Cuff Size: Normal)   Pulse (!) 57   Ht 5\' 10"  (1.778 m)   Wt 175 lb 9.6 oz (79.7 kg)   SpO2 96%   BMI 25.20 kg/m  , BMI Body mass index is 25.2 kg/m. GENERAL:  Well appearing HEENT: Pupils equal round and reactive, fundi not visualized, oral mucosa unremarkable NECK:  No jugular venous distention, waveform within normal limits, carotid upstroke brisk and symmetric, no bruits, no thyromegaly LUNGS:  Clear to auscultation bilaterally HEART:  RRR.  PMI not displaced or sustained,S1 and S2 within normal limits, no S3, no S4, no clicks, no rubs, II/IV diastolic murmur at the LUSB ABD:  Flat, positive bowel sounds normal in frequency in pitch, no bruits, no rebound, no guarding, no midline pulsatile mass, no hepatomegaly, no splenomegaly EXT:  2 plus pulses throughout, no edema, no cyanosis no clubbing SKIN:  No rashes no nodules NEURO:  Cranial nerves II through XII grossly intact, motor grossly intact throughout PSYCH:  Cognitively intact, oriented to person place and time   ASSESSMENT AND PLAN: .    Assessment & Plan #Aortic aneurysm  Ascending aorta dilated at 4.2 cm, aortic root at 4.6 cm. Monitoring necessary as concern threshold is 5 cm. - Order chest CT-A in August to monitor aortic aneurysm. - If there is progression, will refer to CT surgery.  # Aortic regurgitation Patient had good questions about how we follow AR and discussed echo parameters.  He has no symptoms of worsening aortic regurguitation.  - Order echocardiogram in August to assess aortic regurgitation.  # Bicuspid aortic valve Linked to progressive aortic regurgitation and aneurysm. Surgical consultation advised if aortic root reaches 5 cm or other severity indicators arise. We discussed why TAVR is unsuitable.  # Hypertension Blood Burton 136/88 mmHg, above target 130/80 mmHg. Current management includes valsartan  80 mg. Home monitoring not regular due to relocation of monitor. - Instruct to use the  home blood Burton cuff. - Check blood Burton twice daily for one to two weeks. - Send blood Burton readings via MyChart. - Consider increasing valsartan  dose if blood Burton remains above 130/80 mmHg.  # Non-obstructive CAD:  # Hyperlipidemia:  Minimal CAD.  Continue lipid management with atorvastatin  and Zetia .  # Atrial fibrillation:  One isolated episode.  ILR followed by Dr. Arlester Ladd.         Dispo: f/u 06/2024 after imaging  Signed, Maudine Sos, MD

## 2024-03-14 NOTE — Patient Instructions (Addendum)
 Medication Instructions:  Your physician recommends that you continue on your current medications as directed. Please refer to the Current Medication list given to you today.  PLEASE CHECK BLOOD PRESSURE TWICE PER DAY FOR THE NEXT TWO WEEKS AND THEN SEND THOSE READINGS VIA MYCHART TO DR. Mantua   Testing/Procedures: BOTH TESTS IN [place]  Your physician has requested that you have an echocardiogram. Echocardiography is a painless test that uses sound waves to create images of your heart. It provides your doctor with information about the size and shape of your heart and how well your heart's chambers and valves are working. This procedure takes approximately one hour. There are no restrictions for this procedure. Please do NOT wear cologne, perfume, aftershave, or lotions (deodorant is allowed). Please arrive 15 minutes prior to your appointment time.  Please note: We ask at that you not bring children with you during ultrasound (echo/ vascular) testing. Due to room size and safety concerns, children are not allowed in the ultrasound rooms during exams. Our front office staff cannot provide observation of children in our lobby area while testing is being conducted. An adult accompanying a patient to their appointment will only be allowed in the ultrasound room at the discretion of the ultrasound technician under special circumstances. We apologize for any inconvenience.    Your cardiac CT will be scheduled at one of the below locations:   [facility] [address] [place], [place] [address] [phone]  OR   [facility] [address]  [place], [place] [address] Opening March 05, 2024  If scheduled at [facility], please arrive at the [facility] and Children's Entrance (Entrance C2) of [facility] 30 minutes prior to test start time. You can use the FREE valet parking offered at entrance C (encouraged to control the heart rate for  the test)  Proceed to the [facility] Radiology Department (first floor) to check-in and test prep.   All radiology patients and guests should use entrance C2 at [facility], accessed from [facility], even though the hospital's physical address listed is [address].    If scheduled at the Heart and Vascular Tower at [address], please enter the parking lot using the [address] entrance and use the FREE valet service at the patient drop-off area. Enter the buidling and check-in with registration on the main floor.  Please follow these instructions carefully (unless otherwise directed):  An IV will be required for this test and Nitroglycerin  will be given.  Hold all erectile dysfunction medications at least 3 days (72 hrs) prior to test. (Ie viagra, cialis, sildenafil, tadalafil, etc)   On the Night Before the Test: Be sure to Drink plenty of water. Do not consume any caffeinated/decaffeinated beverages or chocolate 12 hours prior to your test. Do not take any antihistamines 12 hours prior to your test. If the patient has contrast allergy: Take Benadryl  50 mg 1 hour prior to test Patient will need a ride after test due to Benadryl .  On the Day of the Test: Drink plenty of water until 1 hour prior to the test. Do not eat any food 1 hour prior to test. You may take your regular medications prior to the test.  Take metoprolol  (Lopressor ) 25mg  two hours prior to test.      After the Test: Drink plenty of water. After receiving IV contrast, you may experience a mild flushed feeling. This is normal. On  occasion, you may experience a mild rash up to 24 hours after the test. This is not dangerous. If this occurs, you can take Benadryl  25 mg, Zyrtec, Claritin, or Allegra and increase your fluid intake. (Patients taking Tikosyn should avoid Benadryl , and may take Zyrtec, Claritin, or Allegra) If you experience trouble breathing, this can be serious. If  it is severe call 911 IMMEDIATELY. If it is mild, please call our office.  We will call to schedule your test 2-4 weeks out understanding that some insurance companies will need an authorization prior to the service being performed.   For more information and frequently asked questions, please visit our website : [email or web address]  For non-scheduling related questions, please contact the cardiac imaging nurse navigator should you have any questions/concerns: Cardiac Imaging Nurse Navigators Direct Office Dial: [phone]   For scheduling needs, including cancellations and rescheduling, please call [place], [phone].   Follow-Up:  Please follow up in AUGUST AFTER TESTING with Dr. Theodis Fiscal, Slater Duncan, NP or Neomi Banks, NP

## 2024-03-19 ENCOUNTER — Encounter (INDEPENDENT_AMBULATORY_CARE_PROVIDER_SITE_OTHER): Payer: Self-pay | Admitting: Cardiology

## 2024-03-19 DIAGNOSIS — G4733 Obstructive sleep apnea (adult) (pediatric): Secondary | ICD-10-CM | POA: Diagnosis not present

## 2024-03-20 ENCOUNTER — Ambulatory Visit: Attending: Cardiovascular Disease

## 2024-03-20 DIAGNOSIS — I4719 Other supraventricular tachycardia: Secondary | ICD-10-CM

## 2024-03-20 DIAGNOSIS — I471 Supraventricular tachycardia, unspecified: Secondary | ICD-10-CM

## 2024-03-20 DIAGNOSIS — I493 Ventricular premature depolarization: Secondary | ICD-10-CM

## 2024-03-20 DIAGNOSIS — I480 Paroxysmal atrial fibrillation: Secondary | ICD-10-CM

## 2024-03-20 NOTE — Procedures (Signed)
    SLEEP STUDY REPORT Patient Information Study Date: 03/19/2024 Patient Name: Ryan Burton Patient ID: [id number] Birth Date: 08/31/1956 Age: 68 Gender: Male BMI: 25.2 (W=176 lb, H=5' 10'') Stopbang: 4 Referring Physician: Marlane Silver, MD  TEST DESCRIPTION: Home sleep apnea testing was completed using the WatchPat, a Type 1 device, utilizing  peripheral arterial tonometry (PAT), chest movement, actigraphy, pulse oximetry, pulse rate, body position and snore.  AHI was calculated with apnea and hypopnea using valid sleep time as the denominator. RDI includes apneas,  hypopneas, and RERAs. The data acquired and the scoring of sleep and all associated events were performed in  accordance with the recommended standards and specifications as outlined in the AASM Manual for the Scoring of  Sleep and Associated Events 2.2.0 (2015).   FINDINGS:   1. Mild Obstructive Sleep Apnea with AHI 12.7/hr overall and moderate during REM sleep with REM AHI 26/hr.   2. No Central Sleep Apnea with pAHIc /hr.   3. Oxygen desaturations as low as 78%.   4. Moderate to severe snoring was present. O2 sats were < 88% for 6 min.   5. Total sleep time was 7 hrs and 8 min.   6. 19.2% of total sleep time was spent in REM sleep.   7. Normal sleep onset latency at 14 min.   8. Shortened REM sleep onset latency at 34 min.   9. Total awakenings were 5.  10. Arrhythmia detection: None  DIAGNOSIS: Mild to Moderate Obstructive Sleep Apnea (G47.33) Nocturnal Hypoxemia  RECOMMENDATIONS: 1. Clinical correlation of these findings is necessary. The decision to treat obstructive sleep apnea (OSA) is usually  based on the presence of apnea symptoms or the presence of associated medical conditions such as Hypertension,  Congestive Heart Failure, Atrial Fibrillation or Obesity. The most common symptoms of OSA are snoring, gasping for  breath while sleeping, daytime sleepiness and fatigue.  2. Initiating apnea  therapy is recommended given the presence of symptoms and/or associated conditions.  Recommend proceeding with one of the following:  a. Auto-CPAP therapy with a pressure range of 5-20cm H2O.  b. An oral appliance (OA) that can be obtained from certain dentists with expertise in sleep medicine. These are  primarily of use in non-obese patients with mild and moderate disease.  c. An ENT consultation which may be useful to look for specific causes of obstruction and possible treatment  options.  d. If patient is intolerant to PAP therapy, consider referral to ENT for evaluation for hypoglossal nerve stimulator.  3. Close follow-up is necessary to ensure success with CPAP or oral appliance therapy for maximum benefit . 4. A follow-up oximetry study on CPAP is recommended to assess the adequacy of therapy and determine the need  for supplemental oxygen or the potential need for Bi-level therapy. An arterial blood gas to determine the adequacy of  baseline ventilation and oxygenation should also be considered. 5. Healthy sleep recommendations include: adequate nightly sleep (normal 7-9 hrs/night), avoidance of caffeine after  noon and alcohol near bedtime, and maintaining a sleep environment that is cool, dark and quiet. 6. Weight loss for overweight patients is recommended. Even modest amounts of weight loss can significantly  improve the severity of sleep apnea. 7. Snoring recommendations include: weight loss where appropriate, side sleeping, and avoidance of alcohol before  bed. 8. Operation of motor vehicle should be avoided when sleepy.  Signature: Gaylyn Keas, MD; [facility]; Diplomat, American Board of Sleep  Medicine Electronically Signed: 03/20/2024 2:50:19 PM

## 2024-03-21 ENCOUNTER — Telehealth: Payer: Self-pay

## 2024-03-21 NOTE — Telephone Encounter (Signed)
-----   Message from Gaylyn Keas sent at 03/20/2024  2:51 PM EDT ----- Please let patient know that they have sleep apnea.  Recommend therapeutic CPAP titration for treatment of patient's sleep disordered breathing.

## 2024-03-21 NOTE — Telephone Encounter (Signed)
 Notified patient of sleep study results and recommendations. Patient requests in-person visit to discuss CPAP benefits and usage as well as other options.

## 2024-03-27 ENCOUNTER — Ambulatory Visit (INDEPENDENT_AMBULATORY_CARE_PROVIDER_SITE_OTHER)

## 2024-03-27 DIAGNOSIS — I480 Paroxysmal atrial fibrillation: Secondary | ICD-10-CM

## 2024-03-28 ENCOUNTER — Encounter (HOSPITAL_BASED_OUTPATIENT_CLINIC_OR_DEPARTMENT_OTHER): Payer: Self-pay

## 2024-03-28 LAB — CUP PACEART REMOTE DEVICE CHECK
Date Time Interrogation Session: 20250520162120
Implantable Pulse Generator Implant Date: 20250310

## 2024-03-29 ENCOUNTER — Encounter (HOSPITAL_BASED_OUTPATIENT_CLINIC_OR_DEPARTMENT_OTHER): Payer: Self-pay | Admitting: *Deleted

## 2024-03-29 ENCOUNTER — Other Ambulatory Visit (HOSPITAL_BASED_OUTPATIENT_CLINIC_OR_DEPARTMENT_OTHER): Payer: Self-pay | Admitting: *Deleted

## 2024-03-29 DIAGNOSIS — I7121 Aneurysm of the ascending aorta, without rupture: Secondary | ICD-10-CM

## 2024-03-30 NOTE — Telephone Encounter (Signed)
 BP at goal <130/80. Continue Valsartan  80mg  daily. Recommend continuing to monitor BP 3x per week and contact our office if routinely >130/80.   Malikah Lakey S Myan Locatelli, NP

## 2024-03-30 NOTE — Telephone Encounter (Signed)
BP log as requested 

## 2024-04-02 ENCOUNTER — Other Ambulatory Visit: Payer: Self-pay | Admitting: Nurse Practitioner

## 2024-04-03 ENCOUNTER — Ambulatory Visit (HOSPITAL_COMMUNITY)

## 2024-04-04 NOTE — Progress Notes (Signed)
 Sent follow up message to patient regarding what pharmacy to send Prednisone  premed to

## 2024-04-05 ENCOUNTER — Ambulatory Visit: Payer: Self-pay | Admitting: Cardiovascular Disease

## 2024-04-06 NOTE — Progress Notes (Signed)
 [facility] Summary Report / Loop Recorder

## 2024-04-06 NOTE — Addendum Note (Signed)
 Addended by: Lott Rouleau A on: 04/06/2024 04:10 PM   Modules accepted: Orders

## 2024-04-25 ENCOUNTER — Ambulatory Visit: Attending: Cardiology | Admitting: Cardiology

## 2024-04-25 VITALS — BP 118/76 | HR 67 | Ht 70.0 in | Wt 176.0 lb

## 2024-04-25 DIAGNOSIS — G4733 Obstructive sleep apnea (adult) (pediatric): Secondary | ICD-10-CM | POA: Diagnosis not present

## 2024-04-25 DIAGNOSIS — I10 Essential (primary) hypertension: Secondary | ICD-10-CM | POA: Diagnosis not present

## 2024-04-25 NOTE — Patient Instructions (Signed)
 Medication Instructions:  Your physician recommends that you continue on your current medications as directed. Please refer to the Current Medication list given to you today.  *If you need a refill on your cardiac medications before your next appointment, please call your pharmacy*  Lab Work: None.  If you have labs (blood work) drawn today and your tests are completely normal, you will receive your results only by: MyChart Message (if you have MyChart) OR A paper copy in the mail If you have any lab test that is abnormal or we need to change your treatment, we will call you to review the results.  Testing/Procedures: None.  Follow-Up: At [facility], you and your health needs are our priority.  As part of our continuing mission to provide you with exceptional heart care, our providers are all part of one team.  This team includes your primary Cardiologist (physician) and Advanced Practice Providers or APPs (Physician Assistants and Nurse Practitioners) who all work together to provide you with the care you need, when you need it.  Your next appointment will be dependent on delivery of your cpap equipment and it will be with:     Provider:   Dr. Gaylyn Keas, MD    Please notify our office when you cpap equipment is delivered.

## 2024-04-25 NOTE — Progress Notes (Addendum)
 Sleep Medicine CONSULT Note    Date:  04/25/2024   ID:  Ryan Burton, DOB 08/08/1956, MRN [id number]  PCP:  Charlott Dorn LABOR, MD  Cardiologist: [name], MD   Chief Complaint  Patient presents with   New Patient (Initial Visit)    OSA    History of Present Illness:  Ryan Burton is a 68 y.o. male who is being seen today for the evaluation of obstructive sleep apnea at the request of [name], MD.  This is a 68 year old male with a history of dilated ascending aorta, paroxysmal atrial tachycardia, bicuspid aortic valve, hypertension, aortic insufficiency, PAF and hyperlipidemia.  He was seen by Dr. Nancey back in May 2025 and due to a history of atrial fibrillation a home sleep study was done.   He felt rested in the am but gets up more at night to use the bathroom. He occasionally will get sleepy during the day after lunch. He has been told that he snores in his sleep by his wife.  This demonstrated mild to moderate obstructive sleep apnea with an AHI of 12.7 overall per hour but during REM sleep was 26/h.  He had very mild nocturnal hypoxemia with O2 saturations less than 88% for 6 minutes.  An auto CPAP was recommended  but he did not want to pursue this until seeing a Sleep Medicine MD. He is now referred for sleep medicine consultation for treatment of obstructive sleep apnea   Past Medical History:  Diagnosis Date   Aneurysm of ascending aorta (HCC) 10/25/2016   4.4cm by echo 10/21/16   Atrial tachycardia (HCC) 01/15/2017   Bicuspid aortic valve 10/25/2016   Chest pain    Chest tightness 11/25/2021   Cold intolerance 11/25/2021   Crohn's disease (HCC)    Dizziness    Essential hypertension 09/04/2019   Moderate aortic regurgitation 10/25/2016   PAF (paroxysmal atrial fibrillation) (HCC) 08/22/2023   Palpitations 09/21/2016   Pure hypercholesterolemia 09/04/2019   PVC (premature ventricular contraction) 01/15/2017   Shortness of breath  01/15/2017    Past Surgical History:  Procedure Laterality Date   KIDNEY STONE SURGERY     PTERYGIUM EXCISION     SHOULDER SURGERY      Current Medications: Current Meds  Medication Sig   atorvastatin  (LIPITOR) 10 MG tablet TAKE 1/2 TABLET BY MOUTH ONCE DAILY   cyanocobalamin 1000 MCG tablet Take 1,000 mcg by mouth.   diphenhydrAMINE  (BENADRYL ) 50 MG tablet TAKE ONE TABLET 1 HOUR PRIOR TO CARDIAC CT   ezetimibe  (ZETIA ) 10 MG tablet TAKE 1 TABLET BY MOUTH EVERY DAY   metoprolol  tartrate (LOPRESSOR ) 25 MG tablet TAKE ONE TABLET TWO HOURS PRIOR TO CARDIAC CT   Multiple Vitamins-Minerals (MULTIVITAMIN WITH MINERALS) tablet Take 1 tablet by mouth daily.   valsartan  (DIOVAN ) 80 MG tablet TAKE 1 TABLET BY MOUTH EVERY DAY    Allergies:   Atorvastatin , Ivp dye [iodinated contrast media], Rosuvastatin , and Shellfish allergy   Social History   Socioeconomic History   Marital status: Married    Spouse name: Not on file   Number of children: Not on file   Years of education: Not on file   Highest education level: Not on file  Occupational History   Not on file  Tobacco Use   Smoking status: Never   Smokeless tobacco: Never  Substance and Sexual Activity   Alcohol use: Yes    Comment: 12 DRINKS PER WEEK, COMBINED BEER, WINE, LIQUOR  Drug use: No   Sexual activity: Not on file  Other Topics Concern   Not on file  Social History Narrative   Not on file   Social Drivers of Health   Financial Resource Strain: Not on file  Food Insecurity: Not on file  Transportation Needs: Not on file  Physical Activity: Not on file  Stress: Not on file  Social Connections: Not on file     Family History:  The patient's family history includes Atrial fibrillation in his mother; Dementia in his father; Stroke in his paternal grandfather.   ROS:   Please see the history of present illness.    ROS All other systems reviewed and are negative.      No data to display              PHYSICAL EXAM:   VS:  BP 118/76 (BP Location: Left Arm)   Pulse 67   Ht 5' 10 (1.778 m)   Wt 176 lb (79.8 kg)   SpO2 96%   BMI 25.25 kg/m    GEN: Well nourished, well developed, in no acute distress  HEENT: normal  Neck: no JVD, carotid bruits, or masses Cardiac: RRR; no murmurs, rubs, or gallops,no edema.  Intact distal pulses bilaterally.  Respiratory:  clear to auscultation bilaterally, normal work of breathing GI: soft, nontender, nondistended, + BS MS: no deformity or atrophy  Skin: warm and dry, no rash Neuro:  Alert and Oriented x 3, Strength and sensation are intact Psych: euthymic mood, full affect  Wt Readings from Last 3 Encounters:  04/25/24 176 lb (79.8 kg)  03/14/24 175 lb 9.6 oz (79.7 kg)  01/16/24 176 lb (79.8 kg)      Studies/Labs Reviewed:   Home sleep study and PAP compliance download  Recent Labs: 10/12/2023: ALT 20; BUN 14; Creatinine, Ser 0.94; Potassium 4.6; Sodium 143    CHA2DS2-VASc Score = 3   This indicates a 3.2% annual risk of stroke. The patient's score is based upon: CHF History: 0 HTN History: 1 Diabetes History: 0 Stroke History: 0 Vascular Disease History: 1 (coronary calcification on CT) Age Score: 1 Gender Score: 0     Additional studies/ records that were reviewed today include:  None    ASSESSMENT:    1. OSA (obstructive sleep apnea)   2. Essential hypertension      PLAN:  In order of problems listed above:  #OSA  -he has a hx of PAF  -HST showed mild OSA overall with an AHI of 12.7/h but moderate during REM sleep with a REM AHI of 26/h -We discussed treatment options for obstructive sleep apnea including CPAP therapy versus the oral device -He felt the oral device was cost prohibitive -I encouraged him to consider the CPAP therapy given his underlying atrial arrhythmias and hypertension -He is willing to try CPAP so I will order an auto CPAP from 4 to 15 cm H2O with heated humidity and nasal pillow  mask -He will see me back 6 weeks after he gets his device  #Hypertension - BP controlled on exam today - Continue prescription drug management with valsartan  80 mg daily with as needed refills   Time Spent: 20 minutes total time of encounter, including 15 minutes spent in face-to-face patient care on the date of this encounter. This time includes coordination of care and counseling regarding above mentioned problem list. Remainder of non-face-to-face time involved reviewing chart documents/testing relevant to the patient encounter and documentation in the medical record.  I have independently reviewed documentation from referring provider  Medication Adjustments/Labs and Tests Ordered: Current medicines are reviewed at length with the patient today.  Concerns regarding medicines are outlined above.  Medication changes, Labs and Tests ordered today are listed in the Patient Instructions below.  There are no Patient Instructions on file for this visit.   Signed, Wilbert Bihari, MD  04/25/2024 9:50 AM    [facility] [address] [place], [place], [place]  [address] Phone: [phone]; Fax: [phone]

## 2024-04-26 ENCOUNTER — Telehealth: Payer: Self-pay | Admitting: *Deleted

## 2024-04-26 DIAGNOSIS — G4733 Obstructive sleep apnea (adult) (pediatric): Secondary | ICD-10-CM

## 2024-04-26 DIAGNOSIS — I10 Essential (primary) hypertension: Secondary | ICD-10-CM

## 2024-04-26 NOTE — Telephone Encounter (Signed)
 Per Dr Micael Adas, Order ResMed CPAP on auto from 4 to 15cm H2O with heated humidity, mask of choice.    Upon patient request DME selection is [facility] Patient understands he will be contacted by [facility] to set up his cpap. Patient understands to call if [facility] does not contact him with new setup in a timely manner. Patient understands they will be called once confirmation has been received from [facility] that they have received their new machine to schedule 10 week follow up appointment.   [facility] notified of new cpap order  Please add to airview Patient was grateful for the call and thanked me.

## 2024-04-26 NOTE — Telephone Encounter (Signed)
-----   Message from Gaylyn Keas sent at 04/25/2024 10:01 AM EDT ----- Order ResMed CPAP on auto from 4 to 15cm H2O with heated humidity, mask of choice.  Will need followup with me in 6 weeks after getting device

## 2024-04-27 ENCOUNTER — Ambulatory Visit (INDEPENDENT_AMBULATORY_CARE_PROVIDER_SITE_OTHER)

## 2024-04-27 DIAGNOSIS — I4719 Other supraventricular tachycardia: Secondary | ICD-10-CM | POA: Diagnosis not present

## 2024-04-30 LAB — CUP PACEART REMOTE DEVICE CHECK
Date Time Interrogation Session: 20250620162207
Implantable Pulse Generator Implant Date: 20250310

## 2024-04-30 NOTE — Telephone Encounter (Signed)
 Please assist

## 2024-05-01 ENCOUNTER — Encounter

## 2024-05-01 NOTE — Telephone Encounter (Signed)
 Please assist

## 2024-05-02 ENCOUNTER — Ambulatory Visit: Payer: Self-pay | Admitting: Cardiovascular Disease

## 2024-05-08 ENCOUNTER — Encounter (HOSPITAL_BASED_OUTPATIENT_CLINIC_OR_DEPARTMENT_OTHER): Payer: Self-pay

## 2024-05-18 NOTE — Progress Notes (Signed)
 [facility] Summary Report / Loop Recorder

## 2024-05-18 NOTE — Addendum Note (Signed)
 Addended by: VICCI SELLER A on: 05/18/2024 09:48 AM   Modules accepted: Orders

## 2024-05-21 ENCOUNTER — Encounter: Payer: Self-pay | Admitting: Cardiology

## 2024-05-28 ENCOUNTER — Ambulatory Visit

## 2024-05-28 DIAGNOSIS — I480 Paroxysmal atrial fibrillation: Secondary | ICD-10-CM

## 2024-05-29 LAB — CUP PACEART REMOTE DEVICE CHECK
Date Time Interrogation Session: 20250721162140
Implantable Pulse Generator Implant Date: 20250310

## 2024-06-02 ENCOUNTER — Ambulatory Visit: Payer: Self-pay | Admitting: Cardiovascular Disease

## 2024-06-05 ENCOUNTER — Encounter

## 2024-06-10 ENCOUNTER — Encounter (HOSPITAL_BASED_OUTPATIENT_CLINIC_OR_DEPARTMENT_OTHER): Payer: Self-pay

## 2024-06-11 MED ORDER — PREDNISONE 50 MG PO TABS: ORAL_TABLET | ORAL | 0 refills | Status: DC

## 2024-06-12 ENCOUNTER — Ambulatory Visit (HOSPITAL_BASED_OUTPATIENT_CLINIC_OR_DEPARTMENT_OTHER)
Admission: RE | Admit: 2024-06-12 | Discharge: 2024-06-12 | Disposition: A | Discharge status: Home / Self Care | Source: Ambulatory Visit | Attending: Cardiovascular Disease | Admitting: Cardiovascular Disease

## 2024-06-12 DIAGNOSIS — I7121 Aneurysm of the ascending aorta, without rupture: Secondary | ICD-10-CM | POA: Diagnosis present

## 2024-06-12 MED ORDER — IOHEXOL 350 MG/ML SOLN
75.0000 mL | Freq: Once | INTRAVENOUS | Status: AC | PRN
  Administered 2024-06-12: 75 mL via INTRAVENOUS

## 2024-06-14 ENCOUNTER — Ambulatory Visit: Payer: Self-pay | Admitting: Cardiovascular Disease

## 2024-06-14 ENCOUNTER — Ambulatory Visit (HOSPITAL_BASED_OUTPATIENT_CLINIC_OR_DEPARTMENT_OTHER)

## 2024-06-14 DIAGNOSIS — I351 Nonrheumatic aortic (valve) insufficiency: Secondary | ICD-10-CM | POA: Diagnosis not present

## 2024-06-14 DIAGNOSIS — I480 Paroxysmal atrial fibrillation: Secondary | ICD-10-CM

## 2024-06-14 DIAGNOSIS — I493 Ventricular premature depolarization: Secondary | ICD-10-CM

## 2024-06-14 DIAGNOSIS — I7121 Aneurysm of the ascending aorta, without rupture: Secondary | ICD-10-CM

## 2024-06-14 DIAGNOSIS — Q2381 Bicuspid aortic valve: Secondary | ICD-10-CM

## 2024-06-14 DIAGNOSIS — I10 Essential (primary) hypertension: Secondary | ICD-10-CM

## 2024-06-14 DIAGNOSIS — E7800 Pure hypercholesterolemia, unspecified: Secondary | ICD-10-CM

## 2024-06-18 LAB — ECHOCARDIOGRAM COMPLETE
AR max vel: 1.92 cm2
AV Area VTI: 1.97 cm2
AV Area mean vel: 1.85 cm2
AV Mean grad: 5 mmHg
AV Peak grad: 10.1 mmHg
AV Vena cont: 0.64 cm
Ao pk vel: 1.59 m/s
Area-P 1/2: 2.5 cm2
MV M vel: 4.74 m/s
MV Peak grad: 89.7 mmHg
P 1/2 time: 1347 ms
Radius: 0.6 cm
S' Lateral: 4.16 cm

## 2024-06-22 NOTE — Addendum Note (Signed)
 Addended by: VICCI SELLER A on: 06/22/2024 02:05 PM   Modules accepted: Orders

## 2024-06-22 NOTE — Progress Notes (Signed)
 [facility] Summary Report / Loop Recorder

## 2024-06-23 ENCOUNTER — Encounter (HOSPITAL_BASED_OUTPATIENT_CLINIC_OR_DEPARTMENT_OTHER): Payer: Self-pay

## 2024-06-25 ENCOUNTER — Ambulatory Visit: Payer: Self-pay | Admitting: Cardiovascular Disease

## 2024-06-26 ENCOUNTER — Encounter (HOSPITAL_BASED_OUTPATIENT_CLINIC_OR_DEPARTMENT_OTHER): Payer: Self-pay | Admitting: Family

## 2024-06-26 ENCOUNTER — Ambulatory Visit (INDEPENDENT_AMBULATORY_CARE_PROVIDER_SITE_OTHER): Admitting: Family

## 2024-06-26 VITALS — BP 116/74 | HR 52 | Ht 70.0 in | Wt 176.0 lb

## 2024-06-26 DIAGNOSIS — I25118 Atherosclerotic heart disease of native coronary artery with other forms of angina pectoris: Secondary | ICD-10-CM

## 2024-06-26 DIAGNOSIS — Q2381 Bicuspid aortic valve: Secondary | ICD-10-CM | POA: Diagnosis not present

## 2024-06-26 DIAGNOSIS — I351 Nonrheumatic aortic (valve) insufficiency: Secondary | ICD-10-CM

## 2024-06-26 DIAGNOSIS — I10 Essential (primary) hypertension: Secondary | ICD-10-CM

## 2024-06-26 DIAGNOSIS — I7121 Aneurysm of the ascending aorta, without rupture: Secondary | ICD-10-CM

## 2024-06-26 DIAGNOSIS — E785 Hyperlipidemia, unspecified: Secondary | ICD-10-CM

## 2024-06-26 NOTE — Patient Instructions (Addendum)
 Medication Instructions:  Continue your current medications.   *If you need a refill on your cardiac medications before your next appointment, please call your pharmacy*  Testing/Procedures: Your physician has requested that you have an echocardiogram in 6 months. Echocardiography is a painless test that uses sound waves to create images of your heart. It provides your doctor with information about the size and shape of your heart and how well your heart's chambers and valves are working. This procedure takes approximately one hour. There are no restrictions for this procedure. Please do NOT wear cologne, perfume, aftershave, or lotions (deodorant is allowed). Please arrive 15 minutes prior to your appointment time.  Please note: We ask at that you not bring children with you during ultrasound (echo/ vascular) testing. Due to room size and safety concerns, children are not allowed in the ultrasound rooms during exams. Our front office staff cannot provide observation of children in our lobby area while testing is being conducted. An adult accompanying a patient to their appointment will only be allowed in the ultrasound room at the discretion of the ultrasound technician under special circumstances. We apologize for any inconvenience.   Follow-Up: At [facility], you and your health needs are our priority.  As part of our continuing mission to provide you with exceptional heart care, our providers are all part of one team.  This team includes your primary Cardiologist (physician) and Advanced Practice Providers or APPs (Physician Assistants and Nurse Practitioners) who all work together to provide you with the care you need, when you need it.  Your next appointment:   6 month(s) after echocardiogram  Provider:   [name], MD or Reche Finder, NP    We recommend signing up for the patient portal called MyChart.  Sign up information is provided on this After Visit Summary.   MyChart is used to connect with patients for Virtual Visits (Telemedicine).  Patients are able to view lab/test results, encounter notes, upcoming appointments, etc.  Non-urgent messages can be sent to your provider as well.   To learn more about what you can do with MyChart, go to [email or web address].   Other Instructions     Could try Biotene dry mouth rinse in the evenings before bed.   We have referred you to Dr. Lucas:  [facility] at [facility] [address], [place],  [place]  [address] Main: [phone]

## 2024-06-26 NOTE — Progress Notes (Signed)
 Cardiology Office Note:  .   Date:  06/26/2024  ID:  Ryan Burton, DOB 10-Feb-1956, MRN [id number] PCP: Charlott Dorn LABOR, MD   [facility] Providers Cardiologist:  [name], MD Electrophysiologist:  Eulas FORBES Furbish, MD    History of Present Illness: .   Ryan Burton is a 68 y.o. male with a hx of mild nonobstructive coronary artery disease, OSA, severe aortic regurgitation, bicuspid aortic valve, ascending aortic dilation, PVC, hypertension, Crohn's.   Established with cardiology in 2017 for chest pain and dizziness.  Due to murmur, echocardiogram ordered revealing LVEF 60 to 65%, grade 1 diastolic dysfunction, functional bicuspid aortic valve with moderate regurgitation, mild to moderate dilation of aortic root 4.4 cm. 48-hour monitor 2017with occasional PVC but otherwise normal sinus rhythm.  ETT 10/2016 negative for ischemia.  Coronary CTA for chest pain 12/02/2021 with coronary calcium  score of 20.2 placing him the 30th percentile for age and sex matched control with mild nonobstructive coronary disease.  Most recent echo 12/07/2022 LVEF 60 to 65%, no RWMA, grade 1 diastolic dysfunction, RV normal size and function, trivial MR, bicuspid aortic valve with moderate AI and no stenosis (mean gradient 6.8 mmHg), moderate dilation aortic root 46 mm, mild dilation ascending aorta 43 mm.  Seen by Dr. Scarce 08/22/2023.  Apple Watch EKG tracings 10/5 revealed atrial fibrillation.  EKG in clinic sinus bradycardia 57 bpm.  He noted occasional lightheadedness with position changes such as bending over to pick up a golf ball but otherwise feeling well.  14-day ZIO placed with predominantly normal sinus rhythm, 15 runs of SVT up to 16 beats and occasional PVC 1.8% burden but no atrial fibrillation. ILR implanted by Dr. Furbish 01/2024. At visit 04/25/24 with Dr. Shlomo, CPAP initiated..  Echo 06/14/2024 LVEF 60 to 65%, no RWMA, grade 1 diastolic dysfunction, mild to moderate MR,  bicuspid aortic valve with right and left effusion with severe regurgitation and no aortic stenosis, aneurysm of ascending aorta 47 mm and moderate dilation aortic root 44 mm.  CTA chest 06/12/2024 with aneurysmal disease of aortic root 4.4-4.6 cm and mild aneurysmal disease up to 4.1 cm.  Discussed the use of AI scribe software for clinical note transcription with the patient, who gave verbal consent to proceed.  History of Present Illness Ryan Burton is a 68 year old male with severe aortic regurgitation who presents for follow-up on his cardiac condition.  He has severe aortic regurgitation with aortic root dilation. Echo reviewed. He does not experience significant symptoms such as shortness of breath or persistent swelling, though there is occasional swelling and stiffness in the left ankle, none appreciable on exam today. SABRA His weight remains stable without sudden weight gain or significant fluid retention. No orthopnea, PND. He does note international travel planned for one year from now and inquires about timing of potential aortic valve surgery.   ROS: Please see the history of present illness.    All other systems reviewed and are negative.   Studies Reviewed: .        Cardiac Studies & Procedures   ______________________________________________________________________________________________   STRESS TESTS  EXERCISE TOLERANCE TEST (ETT) 10/21/2016  Interpretation Summary  Blood pressure demonstrated a normal response to exercise.  There was no ST segment deviation noted during stress.  Clinically and electrically negative for ischemia  Exclellent exercise capacity   ECHOCARDIOGRAM  ECHOCARDIOGRAM COMPLETE 06/14/2024  Narrative ECHOCARDIOGRAM REPORT    Patient Name:   Ryan Burton Date of Exam: 06/14/2024  Medical Rec #:  [id number]          Height:       70.0 in Accession #:    [id number]         Weight:       176.0 lb Date of Birth:  [birth year]          BSA:          1.977 m Patient Age:    67 years           BP:           120/70 mmHg Patient Gender: M                  HR:           54 bpm. Exam Location:  Outpatient  Procedure: 3D Echo, 2D Echo, Color Doppler, Cardiac Doppler and Strain Analysis (Both Spectral and Color Flow Doppler were utilized during procedure).  Indications:     Aortic Regurgitation  History:         Patient has prior history of Echocardiogram examinations, most recent 12/28/2023. Arrythmias:Atrial Fibrillation and PVC; Risk Factors:Non-Smoker, Hypertension and Dyslipidemia. Bicuspid Aortic Valve, Moderate Aortic regurgitation.  Sonographer:     Orvil Holmes RDCS Referring Phys:  [id number] [facility] [place] Diagnosing Phys: Kardie Tobb DO  IMPRESSIONS   1. Left ventricular ejection fraction, by estimation, is 60 to 65%. Left ventricular ejection fraction by 3D volume is 64 %. The left ventricle has normal function. The left ventricle has no regional wall motion abnormalities. Left ventricular diastolic parameters are consistent with Grade I diastolic dysfunction (impaired relaxation). 2. Right ventricular systolic function is normal. The right ventricular size is moderately enlarged. Tricuspid regurgitation signal is inadequate for assessing PA pressure. 3. The mitral valve is normal in structure. Mild to moderate mitral valve regurgitation. No evidence of mitral stenosis. 4. Bicuspid aortic with right and left fusion. The aortic valve is bicuspid. There is mild calcification of the aortic valve. There is moderate thickening of the aortic valve. Aortic valve regurgitation is severe. No aortic stenosis is present. 5. Aortic dilatation noted. Aneurysm of the ascending aorta, measuring 47 mm. There is moderate dilatation of the aortic root, measuring 44 mm. 6. The inferior vena cava is normal in size with greater than 50% respiratory variability, suggesting right atrial pressure of 3 mmHg.  FINDINGS Left  Ventricle: Left ventricular ejection fraction, by estimation, is 60 to 65%. Left ventricular ejection fraction by 3D volume is 64 %. The left ventricle has normal function. The left ventricle has no regional wall motion abnormalities. Global longitudinal strain performed but not reported based on interpreter judgement due to suboptimal tracking. The left ventricular internal cavity size was normal in size. There is no left ventricular hypertrophy. Left ventricular diastolic parameters are consistent with Grade I diastolic dysfunction (impaired relaxation).  Right Ventricle: The right ventricular size is moderately enlarged. No increase in right ventricular wall thickness. Right ventricular systolic function is normal. Tricuspid regurgitation signal is inadequate for assessing PA pressure.  Left Atrium: Left atrial size was normal in size.  Right Atrium: Right atrial size was normal in size.  Pericardium: There is no evidence of pericardial effusion. Presence of epicardial fat layer.  Mitral Valve: The mitral valve is normal in structure. Mild to moderate mitral valve regurgitation. No evidence of mitral valve stenosis.  Tricuspid Valve: The tricuspid valve is normal in structure. Tricuspid valve regurgitation is trivial. No evidence of tricuspid stenosis.  Aortic Valve: Bicuspid  aortic with right and left fusion. The aortic valve is bicuspid. There is mild calcification of the aortic valve. There is moderate thickening of the aortic valve. Aortic valve regurgitation is severe. Aortic regurgitation PHT measures 1347 msec. No aortic stenosis is present. Aortic valve mean gradient measures 5.0 mmHg. Aortic valve peak gradient measures 10.1 mmHg. Aortic valve area, by VTI measures 1.97 cm.  Pulmonic Valve: The pulmonic valve was not well visualized. Pulmonic valve regurgitation is mild. No evidence of pulmonic stenosis.  Aorta: Aortic dilatation noted. There is moderate dilatation of the aortic  root, measuring 44 mm. There is an aneurysm involving the ascending aorta measuring 47 mm.  Venous: The inferior vena cava is normal in size with greater than 50% respiratory variability, suggesting right atrial pressure of 3 mmHg.  IAS/Shunts: No atrial level shunt detected by color flow Doppler.  Additional Comments: 3D was performed not requiring image post processing on an independent workstation and was normal.   LEFT VENTRICLE PLAX 2D LVIDd:         4.40 cm         Diastology LVIDs:         4.16 cm         LV e' medial:    5.11 cm/s LV PW:         1.01 cm         LV E/e' medial:  10.7 LV IVS:        0.96 cm         LV e' lateral:   11.10 cm/s LVOT diam:     2.10 cm         LV E/e' lateral: 4.9 LV SV:         80 LV SV Index:   41 LVOT Area:     3.46 cm        3D Volume EF LV 3D EF:    Left ventricul ar ejection fraction by 3D volume is 64 %.  3D Volume EF: 3D EF:        64 % LV EDV:       169 ml LV ESV:       61 ml LV SV:        108 ml  RIGHT VENTRICLE RV Basal diam:  4.90 cm RV Mid diam:    4.52 cm RV S prime:     10.20 cm/s TAPSE (M-mode): 2.6 cm  LEFT ATRIUM             Index        RIGHT ATRIUM           Index LA diam:        4.00 cm 2.02 cm/m   RA Area:     16.00 cm LA Vol (A2C):   87.1 ml 44.06 ml/m  RA Volume:   39.30 ml  19.88 ml/m LA Vol (A4C):   50.1 ml 25.34 ml/m LA Biplane Vol: 65.4 ml 33.08 ml/m AORTIC VALVE AV Area (Vmax):    1.92 cm AV Area (Vmean):   1.85 cm AV Area (VTI):     1.97 cm AV Vmax:           159.00 cm/s AV Vmean:          108.000 cm/s AV VTI:            0.408 m AV Peak Grad:      10.1 mmHg AV Mean Grad:      5.0 mmHg LVOT  Vmax:         88.30 cm/s LVOT Vmean:        57.600 cm/s LVOT VTI:          0.232 m LVOT/AV VTI ratio: 0.57 AI PHT:            1347 msec AR Vena Contracta: 0.64 cm  AORTA Ao Root diam: 4.40 cm Ao Asc diam:  4.70 cm  MITRAL VALVE MV Area (PHT): 2.50 cm       SHUNTS MV Decel Time: 303 msec        Systemic VTI:  0.23 m MR Peak grad:    89.7 mmHg    Systemic Diam: 2.10 cm MR Mean grad:    72.0 mmHg MR Vmax:         473.50 cm/s MR Vmean:        412.0 cm/s MR PISA:         2.26 cm MR PISA Eff ROA: 15 mm MR PISA Radius:  0.60 cm MV E velocity: 54.80 cm/s MV A velocity: 63.20 cm/s MV E/A ratio:  0.87  Kardie Tobb DO Electronically signed by Dub Huntsman DO Signature Date/Time: 06/14/2024/2:48:10 PM    Final (Updated)    MONITORS  LONG TERM MONITOR (3-14 DAYS) 10/11/2023  Narrative 14 Day Zio Monitor  Quality: Fair.  Baseline artifact. Predominant rhythm: sinus rhythm Average heart rate: 65 bpm Max heart rate: 123 bpm Min heart rate: 42 bpm Pauses >2.5 seconds: none  15 runs of SVT lasting up to 16 beats Occasional PVCs (1.8%)  Tiffany C. Raford, MD, [facility] 11/02/2023 9:01 PM   CT SCANS  CT CORONARY MORPH W/CTA COR W/SCORE 12/02/2021  Addendum 12/02/2021  2:10 PM ADDENDUM REPORT: 12/02/2021 14:08  CLINICAL DATA:  This is a 68 year old male with chest pain.  EXAM: Cardiac/Coronary  CTA  TECHNIQUE: The patient was scanned on a [employer].  FINDINGS: A 100 kV prospective scan was triggered in the descending thoracic aorta at 111 HU's. Axial non-contrast 3 mm slices were carried out through the heart. The data set was analyzed on a dedicated work station and scored using the Agatson method. Gantry rotation speed was 250 msecs and collimation was .6 mm. No beta blockade and 0.8 mg of sl NTG was given. The 3D data set was reconstructed in 5% intervals of the 67-82 % of the R-R cycle. Diastolic phases were analyzed on a dedicated work station using MPR, MIP and VRT modes. The patient received 80 cc of contrast.  Aorta: Proximal ascending aorta is aneurysmal (40.1 mm). No calcifications. No dissection.  Aortic Valve:  Trileaflet.  No calcifications.  Coronary Arteries:  Normal coronary origin.  Right dominance.  RCA is a large dominant  artery that gives rise to PDA and PLA. There is no plaque.  Left main is a large artery that gives rise to LAD and LCX arteries.  LAD is a large vessel. The LAD with no plaques. Mild (25-49%) calcification at the ostium of the D1 vessel.  LCX is a non-dominant artery that gives rise to one large OM1 branch. There is no plaque.  Coronary Calcium  Score:  Left main: 0  Left anterior descending artery: 17.7  Left circumflex artery: 0  Right coronary artery: 2.49  Total: 20.2  Percentile: 30  Other findings:  Normal pulmonary vein drainage into the left atrium.  Normal left atrial appendage without a thrombus.  Normal size of the pulmonary artery.  IMPRESSION: 1. Coronary calcium  score  of 20.2. This was 30 percentile for age and sex matched control.  2. Normal coronary origin with right dominance.  3. Mild Coronary artery disease. CAD-RADS 2. Mild non-obstructive CAD (25-49%). Consider non-atherosclerotic causes of chest pain. Consider preventive therapy and risk factor modification.  4. Proximal ascending aorta aneurysm (40.1 mm).   Electronically Signed By: Kardie  Tobb D.O. On: 12/02/2021 14:08  Narrative EXAM: OVER-READ INTERPRETATION  CT CHEST  The following report is an over-read performed by radiologist Dr. Toribio Aye of [facility] on 12/02/2021. This over-read does not include interpretation of cardiac or coronary anatomy or pathology. The coronary calcium  score/coronary CTA interpretation by the cardiologist is attached.  COMPARISON:  None.  FINDINGS: Extracardiac findings will be described separately under dictation for contemporaneously obtained chest CTA.  IMPRESSION: Please see separate dictation for contemporaneously obtained chest CTA dated 12/02/2021 for full description of relevant extracardiac findings.  Electronically Signed: By: Toribio Aye M.D. On: 12/02/2021 09:46      ______________________________________________________________________________________________        Risk Assessment/Calculations:    CHA2DS2-VASc Score = 3   This indicates a 3.2% annual risk of stroke. The patient's score is based upon: CHF History: 0 HTN History: 1 Diabetes History: 0 Stroke History: 0 Vascular Disease History: 1 (coronary calcification on CT) Age Score: 1 Gender Score: 0        STOP-Bang Score:  4      Physical Exam:   VS:  BP 116/74 (BP Location: Right Arm, Patient Position: Sitting)   Pulse (!) 52   Ht 5' 10 (1.778 m)   Wt 176 lb (79.8 kg)   SpO2 96%   BMI 25.25 kg/m    Wt Readings from Last 3 Encounters:  06/26/24 176 lb (79.8 kg)  04/25/24 176 lb (79.8 kg)  03/14/24 175 lb 9.6 oz (79.7 kg)    GEN: Well nourished, well developed in no acute distress NECK: No JVD; No carotid bruits CARDIAC: RRR, no murmurs, rubs, gallops RESPIRATORY:  Clear to auscultation without rales, wheezing or rhonchi  ABDOMEN: Soft, non-tender, non-distended EXTREMITIES:  No edema; No deformity   ASSESSMENT AND PLAN: .    CAD/HLD, LDL goal within 70- Stable with no anginal symptoms. No indication for ischemic evaluation.  GDMT aspirin, atorvastatin , Zetia .  No beta-blocker due to bradycardia.  10/12/2023 LDL 78.  Previously did not tolerate higher doses of atorvastatin  or rosuvastatin .  Continue atorvastatin  5 mg daily, Zetia  10 mg daily.  Will proceed with lifestyle changes.    HTN - BP well controlled. Continue current antihypertensive regimen.    PAF/PVC/SVT- Prior AFib by apple watch. ILR In place, no PAF noted. Denies recent palpitations. No BB due to bradycardia.   Mild ascending aortic aneurysm / Aortic root aneurysm / Bicuspid AV / Severe AI - Echo 06/2024 normal LVEF, severe AI. CT chest 06/2024 aortic root 4.4-4.6, ascending aorta 4.1. no BB due to bradycardia. Reports no dyspnea, symptoms of HF. Discussed plan for surgical intervention if symptomatic or  reduced LVEF. He has international travel planned in one year, wishes to discuss timing of AV replacement with TCTS, will refer to Dr. Lucas. Echo in 6 mos for monitoring.   OSA - CPAP compliance encouraged. Using regularly.      Dispo: follow up with Dr. Raford 03/2024 with lipid panel one week prior  Signed, Reche GORMAN Finder, NP

## 2024-06-28 ENCOUNTER — Ambulatory Visit

## 2024-06-29 LAB — CUP PACEART REMOTE DEVICE CHECK
Date Time Interrogation Session: 20250821162233
Implantable Pulse Generator Implant Date: 20250310

## 2024-07-04 ENCOUNTER — Ambulatory Visit: Payer: Self-pay | Admitting: Cardiovascular Disease

## 2024-07-05 ENCOUNTER — Telehealth: Payer: Self-pay

## 2024-07-05 NOTE — Telephone Encounter (Signed)
 SABRA

## 2024-07-05 NOTE — Telephone Encounter (Deleted)
 SABRA

## 2024-07-09 NOTE — Progress Notes (Unsigned)
 SLEEP MEDICINE VIRTUAL VISIT via Video Note   Because of Ryan Burton's co-morbid illnesses, he is at least at moderate risk for complications without adequate follow up.  This format is felt to be most appropriate for this patient at this time.  All issues noted in this document were discussed and addressed.  A limited physical exam was performed with this format.  Please refer to the patient's chart for his consent to telehealth for [facility].     Date:  07/10/2024   ID:  Ryan Burton, DOB 02-22-1956, MRN [id number] The patient was identified using 2 identifiers.  Patient Location: Home Provider Location: Home Office   PCP:  Charlott Dorn LABOR, MD   [facility] Providers Cardiologist:  [name], MD Electrophysiologist:  Eulas BRAVO Furbish, MD     Evaluation Performed:  Follow-Up Visit  Chief Complaint:  OSA  History of Present Illness:    Ryan Burton is a 68 y.o. male with with a history of dilated ascending aorta, paroxysmal atrial tachycardia, bicuspid aortic valve, hypertension, aortic insufficiency, PAF and hyperlipidemia.  He was seen by Dr. Furbish back in May 2025 and due to a history of atrial fibrillation a home sleep study was done.   He felt rested in the am but gets up more at night to use the bathroom. He occasionally will get sleepy during the day after lunch. He has been told that he snores in his sleep by his wife.   HST  03/19/2024 demonstrated mild to moderate obstructive sleep apnea with an AHI of 12.7 overall per hour but during REM sleep was 26/h.  He had very mild nocturnal hypoxemia with O2 saturations less than 88% for 6 minutes. When I saw him in May 2025, he agreed to start auto CPAP from 4 to 15cm H2O.  He is now back for followup.    He is doing well with his PAP device and thinks that He has gotten used to it.  He tolerates the nasal cushion mask and feels the pressure is adequate.  Since going on PAP he  feels rested in the am and has no significant daytime sleepiness if he slept well the night before.  Occasionally he will nap if he did not sleep well the night before.  He has had some mild mouth dryness.  He denies any significant nasal dryness or nasal congestion.  Patient denies any episodes of bruxism, restless legs, No gagging hallucinations or cataplectic events.     Past Medical History:  Diagnosis Date   Aneurysm of ascending aorta (HCC) 10/25/2016   4.4cm by echo 10/21/16   Atrial tachycardia (HCC) 01/15/2017   Bicuspid aortic valve 10/25/2016   Chest pain    Chest tightness 11/25/2021   Cold intolerance 11/25/2021   Crohn's disease (HCC)    Dizziness    Essential hypertension 09/04/2019   Moderate aortic regurgitation 10/25/2016   OSA on CPAP    moderate obstructive sleep apnea with an AHI of 12.7 overall per hour but during REM sleep was 26/h.  He had very mild nocturnal hypoxemia with O2 saturations less than 88% for 6 minutes. On auto CPAP 5-15cm H2O   PAF (paroxysmal atrial fibrillation) (HCC) 08/22/2023   Palpitations 09/21/2016   Pure hypercholesterolemia 09/04/2019   PVC (premature ventricular contraction) 01/15/2017   Shortness of breath 01/15/2017   Past Surgical History:  Procedure Laterality Date   KIDNEY STONE SURGERY     PTERYGIUM EXCISION  SHOULDER SURGERY       Current Meds  Medication Sig   atorvastatin  (LIPITOR) 10 MG tablet TAKE 1/2 TABLET BY MOUTH ONCE DAILY   cyanocobalamin 1000 MCG tablet Take 1,000 mcg by mouth.   ezetimibe  (ZETIA ) 10 MG tablet TAKE 1 TABLET BY MOUTH EVERY DAY   Multiple Vitamins-Minerals (MULTIVITAMIN WITH MINERALS) tablet Take 1 tablet by mouth daily.   valsartan  (DIOVAN ) 80 MG tablet TAKE 1 TABLET BY MOUTH EVERY DAY     Allergies:   Atorvastatin , Ivp dye [iodinated contrast media], Rosuvastatin , and Shellfish allergy   Social History   Tobacco Use   Smoking status: Never   Smokeless tobacco: Never  Substance Use  Topics   Alcohol use: Yes    Comment: 12 DRINKS PER WEEK, COMBINED BEER, WINE, LIQUOR    Drug use: No     Family Hx: The patient's family history includes Atrial fibrillation in his mother; Dementia in his father; Stroke in his paternal grandfather.  ROS:   Please see the history of present illness.     All other systems reviewed and are negative.   Prior Sleep studies:   The following studies were reviewed today:  PAP compliance download  Labs/Other Tests and Data Reviewed:     Recent Labs: 10/12/2023: ALT 20; BUN 14; Creatinine, Ser 0.94; Potassium 4.6; Sodium 143   Wt Readings from Last 3 Encounters:  07/10/24 173 lb (78.5 kg)  06/26/24 176 lb (79.8 kg)  04/25/24 176 lb (79.8 kg)     Risk Assessment/Calculations:      Objective:    Vital Signs:  BP 116/74   Pulse (!) 52   Ht 5' 10 (1.778 m)   Wt 173 lb (78.5 kg)   BMI 24.82 kg/m    VITAL SIGNS:  reviewed GEN:  no acute distress EYES:  sclerae anicteric, EOMI - Extraocular Movements Intact RESPIRATORY:  normal respiratory effort, symmetric expansion CARDIOVASCULAR:  no peripheral edema SKIN:  no rash, lesions or ulcers. MUSCULOSKELETAL:  no obvious deformities. NEURO:  alert and oriented x 3, no obvious focal deficit PSYCH:  normal affect  ASSESSMENT & PLAN:    OSA - The patient is tolerating PAP therapy well without any problems. The PAP download performed by his DME was personally reviewed and interpreted by me today and showed an AHI of 1/hr on auto CPAP from 4 to 15 cm H2O with 30% compliance in using more than 4 hours nightly.  The patient has been using and benefiting from PAP use and will continue to benefit from therapy.  -he has only been averaging 3 hrs and 12 min as he does not put it back on after going to the bathroom -he has tried to keep it on all night over the past week -encouraged him to be more compliant with his device and aim for using at least 5 hours nightly to meet his  compliance -he would like a travel CPAP for when he goes on trips so I will give him a Rx for a travel CPAP  HTN -BP controlled on exam today -continue Valsartan  80mg  daily with PRN refills    Total time of encounter: 20 minutes total time of encounter, including 15 minutes spent in face-to-face patient care on the date of this encounter. This time includes coordination of care and counseling regarding above mentioned problem list. Remainder of non-face-to-face time involved reviewing chart documents/testing relevant to the patient encounter and documentation in the medical record. I have independently reviewed documentation from  referring provider.    Medication Adjustments/Labs and Tests Ordered: Current medicines are reviewed at length with the patient today.  Concerns regarding medicines are outlined above.   Tests Ordered: No orders of the defined types were placed in this encounter.   Medication Changes: No orders of the defined types were placed in this encounter.   Follow Up:  In Person in 1 year(s)  Signed, Wilbert Bihari, MD  07/10/2024 8:23 AM    [facility]

## 2024-07-10 ENCOUNTER — Ambulatory Visit: Attending: Cardiology | Admitting: Cardiology

## 2024-07-10 ENCOUNTER — Encounter

## 2024-07-10 ENCOUNTER — Encounter: Payer: Self-pay | Admitting: Cardiology

## 2024-07-10 VITALS — BP 116/74 | HR 52 | Ht 70.0 in | Wt 173.0 lb

## 2024-07-10 DIAGNOSIS — G4733 Obstructive sleep apnea (adult) (pediatric): Secondary | ICD-10-CM | POA: Diagnosis not present

## 2024-07-10 DIAGNOSIS — I10 Essential (primary) hypertension: Secondary | ICD-10-CM | POA: Diagnosis not present

## 2024-07-10 NOTE — Patient Instructions (Signed)
 Medication Instructions:  Your physician recommends that you continue on your current medications as directed. Please refer to the Current Medication list given to you today.  *If you need a refill on your cardiac medications before your next appointment, please call your pharmacy*  Lab Work: None.  If you have labs (blood work) drawn today and your tests are completely normal, you will receive your results only by: MyChart Message (if you have MyChart) OR A paper copy in the mail If you have any lab test that is abnormal or we need to change your treatment, we will call you to review the results.  Testing/Procedures: None.  Follow-Up: At [facility], you and your health needs are our priority.  As part of our continuing mission to provide you with exceptional heart care, our providers are all part of one team.  This team includes your primary Cardiologist (physician) and Advanced Practice Providers or APPs (Physician Assistants and Nurse Practitioners) who all work together to provide you with the care you need, when you need it.  Your next appointment:   1 year(s)  Provider:   Dr. Wilbert Bihari, MD   We recommend signing up for the patient portal called MyChart.  Sign up information is provided on this After Visit Summary.  MyChart is used to connect with patients for Virtual Visits (Telemedicine).  Patients are able to view lab/test results, encounter notes, upcoming appointments, etc.  Non-urgent messages can be sent to your provider as well.   To learn more about what you can do with MyChart, go to [email or web address].   Other Instructions Dr. Bihari has ordered a cpap machine for you. Please let us  know if you have not heard from your insurance or DME company in 2 weeks regarding approval or delivery.

## 2024-07-12 ENCOUNTER — Telehealth: Payer: Self-pay | Admitting: *Deleted

## 2024-07-12 DIAGNOSIS — I10 Essential (primary) hypertension: Secondary | ICD-10-CM

## 2024-07-12 DIAGNOSIS — G4733 Obstructive sleep apnea (adult) (pediatric): Secondary | ICD-10-CM

## 2024-07-12 DIAGNOSIS — I25118 Atherosclerotic heart disease of native coronary artery with other forms of angina pectoris: Secondary | ICD-10-CM

## 2024-07-12 NOTE — Telephone Encounter (Signed)
-----   Message from Nurse Geni E sent at 07/10/2024  8:35 AM EDT ----- Please order a travel CPAP auto 4-15cm H2O  . Thanks! Erica

## 2024-07-12 NOTE — Telephone Encounter (Addendum)
 Per Dr Shlomo, Please order travel CPAP auto 4-15cm H2O.Travel cpap ordered. Rx mailed to patient physical address Per patient request.

## 2024-07-26 ENCOUNTER — Other Ambulatory Visit (HOSPITAL_BASED_OUTPATIENT_CLINIC_OR_DEPARTMENT_OTHER): Payer: Self-pay | Admitting: Family

## 2024-07-29 ENCOUNTER — Encounter

## 2024-07-30 ENCOUNTER — Ambulatory Visit (INDEPENDENT_AMBULATORY_CARE_PROVIDER_SITE_OTHER)

## 2024-07-30 DIAGNOSIS — I480 Paroxysmal atrial fibrillation: Secondary | ICD-10-CM

## 2024-07-30 LAB — CUP PACEART REMOTE DEVICE CHECK
Date Time Interrogation Session: 20250921234054
Implantable Pulse Generator Implant Date: 20250310

## 2024-07-31 NOTE — Progress Notes (Signed)
 Remote Loop Recorder Transmission

## 2024-08-08 ENCOUNTER — Ambulatory Visit: Attending: Surgery | Admitting: Surgery

## 2024-08-08 ENCOUNTER — Encounter: Payer: Self-pay | Admitting: Surgery

## 2024-08-08 VITALS — BP 144/92 | HR 63 | Resp 18 | Ht 70.0 in | Wt 179.0 lb

## 2024-08-08 DIAGNOSIS — I351 Nonrheumatic aortic (valve) insufficiency: Secondary | ICD-10-CM

## 2024-08-08 DIAGNOSIS — Q2381 Bicuspid aortic valve: Secondary | ICD-10-CM | POA: Diagnosis not present

## 2024-08-08 DIAGNOSIS — I7121 Aneurysm of the ascending aorta, without rupture: Secondary | ICD-10-CM | POA: Diagnosis not present

## 2024-08-08 NOTE — Progress Notes (Signed)
 [address], [place] [address]             [phone]    PCP is Charlott Dorn LABOR, MD Referring Provider is [name], NP Primary Cardiologist: [name], MD Chief Complaint  Patient presents with   Thoracic Aortic Aneurysm    HPI:  The patient is a 68 year old gentleman with a history of hypertension, hypercholesterolemia, paroxysmal atrial fibrillation, OSA on CPAP, Crohn's disease, borderline diabetes, bicuspid aortic valve insufficiency, and ascending aortic aneurysm who was referred for surgical evaluation.  He has been followed by Dr. Scarce with serial echocardiograms.  His most recent echocardiogram on 06/14/2024 showed a functionally bicuspid aortic valve with fusion of the right and left cusps.  There was mild calcification and thickening of the valve leaflets.  The echo was read as severe aortic insufficiency with a vena contracta of 0.64 cm but a pressure half-time of 1347 ms.  Left ventricular ejection fraction was 60 to 65% with grade 1 diastolic dysfunction.  LV diastolic diameter was 4.4 cm.  There is mild to moderate mitral regurgitation.  The aortic root diameter was measured at 4.4 cm with an ascending aortic diameter of 4.7 cm.  A recent CTA of the chest on 06/12/2024 showed the aortic root to measure 4.4 to 4.6 cm at the sinus level and the ascending aorta to measure 4.1 cm at the level of the right pulmonary artery  The patient is here today with his wife.  He remains physically active without chest pain or shortness of breath.  He has no tiredness or exertional fatigue.  He has had no orthopnea or peripheral edema.  He denies any dizziness or syncope.   Past Medical History:  Diagnosis Date   Aneurysm of ascending aorta 10/25/2016   4.4cm by echo 10/21/16   Atrial tachycardia 01/15/2017   Bicuspid aortic valve 10/25/2016   Chest pain    Chest tightness 11/25/2021   Cold intolerance 11/25/2021   Crohn's disease (HCC)     Dizziness    Essential hypertension 09/04/2019   Moderate aortic regurgitation 10/25/2016   OSA on CPAP    moderate obstructive sleep apnea with an AHI of 12.7 overall per hour but during REM sleep was 26/h.  He had very mild nocturnal hypoxemia with O2 saturations less than 88% for 6 minutes. On auto CPAP 5-15cm H2O   PAF (paroxysmal atrial fibrillation) (HCC) 08/22/2023   Palpitations 09/21/2016   Pure hypercholesterolemia 09/04/2019   PVC (premature ventricular contraction) 01/15/2017   Shortness of breath 01/15/2017    Past Surgical History:  Procedure Laterality Date   KIDNEY STONE SURGERY     PTERYGIUM EXCISION     SHOULDER SURGERY      Family History  Problem Relation Age of Onset   Atrial fibrillation Mother    Dementia Father    Stroke Paternal Grandfather     Social History Social History   Tobacco Use   Smoking status: Never   Smokeless tobacco: Never  Substance Use Topics   Alcohol use: Yes    Comment: 12 DRINKS PER WEEK, COMBINED BEER, WINE, LIQUOR    Drug use: No    Current Outpatient Medications  Medication Sig Dispense Refill   atorvastatin  (LIPITOR) 10 MG tablet TAKE 1/2 TABLET BY MOUTH ONCE DAILY 45 tablet 3   cyanocobalamin 1000 MCG tablet Take 1,000 mcg by mouth.     ezetimibe  (ZETIA ) 10 MG tablet TAKE 1 TABLET BY  MOUTH EVERY DAY 90 tablet 2   Multiple Vitamins-Minerals (MULTIVITAMIN WITH MINERALS) tablet Take 1 tablet by mouth daily.     valsartan  (DIOVAN ) 80 MG tablet TAKE 1 TABLET BY MOUTH EVERY DAY 90 tablet 2   No current facility-administered medications for this visit.    Allergies  Allergen Reactions   Atorvastatin  Rash   Ivp Dye [Iodinated Contrast Media] Hives   Rosuvastatin  Rash   Shellfish Allergy Diarrhea    Review of Systems  Constitutional:  Negative for activity change and fatigue.  HENT: Negative.    Eyes: Negative.   Respiratory:  Negative for shortness of breath.   Cardiovascular:  Negative for chest pain,  palpitations and leg swelling.  Gastrointestinal: Negative.   Endocrine: Negative.   Genitourinary: Negative.   Musculoskeletal: Negative.   Skin: Negative.   Allergic/Immunologic: Negative.   Neurological:  Negative for dizziness and syncope.  Hematological: Negative.   Psychiatric/Behavioral: Negative.      BP (!) 144/92 (BP Location: Right Arm)   Pulse 63   Resp 18   Ht 5' 10 (1.778 m)   Wt 179 lb (81.2 kg)   SpO2 91%   BMI 25.68 kg/m  Physical Exam Constitutional:      Appearance: Normal appearance. He is normal weight.  HENT:     Head: Normocephalic and atraumatic.  Eyes:     Extraocular Movements: Extraocular movements intact.     Conjunctiva/sclera: Conjunctivae normal.     Pupils: Pupils are equal, round, and reactive to light.  Neck:     Vascular: No carotid bruit.  Cardiovascular:     Rate and Rhythm: Normal rate and regular rhythm.     Pulses: Normal pulses.     Heart sounds: Normal heart sounds. No murmur heard. Pulmonary:     Effort: Pulmonary effort is normal.     Breath sounds: Normal breath sounds.  Musculoskeletal:        General: No swelling.  Skin:    General: Skin is warm and dry.  Neurological:     General: No focal deficit present.     Mental Status: He is alert and oriented to person, place, and time.  Psychiatric:        Mood and Affect: Mood normal.        Behavior: Behavior normal.      Diagnostic Tests:  ECHOCARDIOGRAM REPORT       Patient Name:   [place] Date of Exam: 06/14/2024 Medical Rec #:  [id number]          Height:       70.0 in Accession #:    [id number]         Weight:       176.0 lb Date of Birth:  30-Apr-1956         BSA:          1.977 m Patient Age:    63 years           BP:           120/70 mmHg Patient Gender: M                  HR:           54 bpm. Exam Location:  Outpatient  Procedure: 3D Echo, 2D Echo, Color Doppler, Cardiac Doppler and Strain Analysis            (Both Spectral and Color Flow  Doppler were utilized during  procedure).  Indications:     Aortic Regurgitation   History:         Patient has prior history of Echocardiogram examinations, most                  recent 12/28/2023. Arrythmias:Atrial Fibrillation and PVC; Risk                  Factors:Non-Smoker, Hypertension and Dyslipidemia. Bicuspid                  Aortic Valve, Moderate Aortic regurgitation.   Sonographer:     Orvil Holmes RDCS Referring Phys:  [id number] [facility] Diagnosing Phys: Kardie Tobb DO  IMPRESSIONS    1. Left ventricular ejection fraction, by estimation, is 60 to 65%. Left ventricular ejection fraction by 3D volume is 64 %. The left ventricle has normal function. The left ventricle has no regional wall motion abnormalities. Left ventricular diastolic  parameters are consistent with Grade I diastolic dysfunction (impaired relaxation).  2. Right ventricular systolic function is normal. The right ventricular size is moderately enlarged. Tricuspid regurgitation signal is inadequate for assessing PA pressure.  3. The mitral valve is normal in structure. Mild to moderate mitral valve regurgitation. No evidence of mitral stenosis.  4. Bicuspid aortic with right and left fusion. The aortic valve is bicuspid. There is mild calcification of the aortic valve. There is moderate thickening of the aortic valve. Aortic valve regurgitation is severe. No aortic stenosis is present.  5. Aortic dilatation noted. Aneurysm of the ascending aorta, measuring 47 mm. There is moderate dilatation of the aortic root, measuring 44 mm.  6. The inferior vena cava is normal in size with greater than 50% respiratory variability, suggesting right atrial pressure of 3 mmHg.  FINDINGS  Left Ventricle: Left ventricular ejection fraction, by estimation, is 60 to 65%. Left ventricular ejection fraction by 3D volume is 64 %. The left ventricle has normal function. The left ventricle has no  regional wall motion abnormalities. Global longitudinal strain performed but not reported based on interpreter judgement due to suboptimal tracking. The left ventricular internal cavity size was normal in size. There is no left ventricular hypertrophy. Left ventricular diastolic parameters are consistent with Grade I diastolic dysfunction (impaired relaxation).  Right Ventricle: The right ventricular size is moderately enlarged. No increase in right ventricular wall thickness. Right ventricular systolic function is normal. Tricuspid regurgitation signal is inadequate for assessing PA pressure.  Left Atrium: Left atrial size was normal in size.  Right Atrium: Right atrial size was normal in size.  Pericardium: There is no evidence of pericardial effusion. Presence of epicardial fat layer.  Mitral Valve: The mitral valve is normal in structure. Mild to moderate mitral valve regurgitation. No evidence of mitral valve stenosis.  Tricuspid Valve: The tricuspid valve is normal in structure. Tricuspid valve regurgitation is trivial. No evidence of tricuspid stenosis.  Aortic Valve: Bicuspid aortic with right and left fusion. The aortic valve is bicuspid. There is mild calcification of the aortic valve. There is moderate thickening of the aortic valve. Aortic valve regurgitation is severe. Aortic regurgitation PHT measures 1347 msec. No aortic stenosis is present. Aortic valve mean gradient measures 5.0 mmHg. Aortic valve peak gradient measures 10.1 mmHg. Aortic valve area, by VTI measures 1.97 cm.  Pulmonic Valve: The pulmonic valve was not well visualized. Pulmonic valve regurgitation is mild. No evidence of pulmonic stenosis.  Aorta: Aortic dilatation noted. There is moderate dilatation of the aortic root, measuring 44  mm. There is an aneurysm involving the ascending aorta measuring 47 mm.  Venous: The inferior vena cava is normal in size with greater than 50% respiratory  variability, suggesting right atrial pressure of 3 mmHg.  IAS/Shunts: No atrial level shunt detected by color flow Doppler.  Additional Comments: 3D was performed not requiring image post processing on an independent workstation and was normal.    LEFT VENTRICLE PLAX 2D LVIDd:         4.40 cm         Diastology LVIDs:         4.16 cm         LV e' medial:    5.11 cm/s LV PW:         1.01 cm         LV E/e' medial:  10.7 LV IVS:        0.96 cm         LV e' lateral:   11.10 cm/s LVOT diam:     2.10 cm         LV E/e' lateral: 4.9 LV SV:         80 LV SV Index:   41 LVOT Area:     3.46 cm        3D Volume EF                                LV 3D EF:    Left                                             ventricul                                             ar                                             ejection                                             fraction                                             by 3D                                             volume is                                             64 %.  3D Volume EF:                                3D EF:        64 %                                LV EDV:       169 ml                                LV ESV:       61 ml                                LV SV:        108 ml  RIGHT VENTRICLE RV Basal diam:  4.90 cm RV Mid diam:    4.52 cm RV S prime:     10.20 cm/s TAPSE (M-mode): 2.6 cm  LEFT ATRIUM             Index        RIGHT ATRIUM           Index LA diam:        4.00 cm 2.02 cm/m   RA Area:     16.00 cm LA Vol (A2C):   87.1 ml 44.06 ml/m  RA Volume:   39.30 ml  19.88 ml/m LA Vol (A4C):   50.1 ml 25.34 ml/m LA Biplane Vol: 65.4 ml 33.08 ml/m  AORTIC VALVE AV Area (Vmax):    1.92 cm AV Area (Vmean):   1.85 cm AV Area (VTI):     1.97 cm AV Vmax:           159.00 cm/s AV Vmean:          108.000 cm/s AV VTI:            0.408 m AV Peak Grad:      10.1 mmHg AV Mean Grad:       5.0 mmHg LVOT Vmax:         88.30 cm/s LVOT Vmean:        57.600 cm/s LVOT VTI:          0.232 m LVOT/AV VTI ratio: 0.57 AI PHT:            1347 msec AR Vena Contracta: 0.64 cm   AORTA Ao Root diam: 4.40 cm Ao Asc diam:  4.70 cm  MITRAL VALVE MV Area (PHT): 2.50 cm       SHUNTS MV Decel Time: 303 msec       Systemic VTI:  0.23 m MR Peak grad:    89.7 mmHg    Systemic Diam: 2.10 cm MR Mean grad:    72.0 mmHg MR Vmax:         473.50 cm/s MR Vmean:        412.0 cm/s MR PISA:         2.26 cm MR PISA Eff ROA: 15 mm MR PISA Radius:  0.60 cm MV E velocity: 54.80 cm/s MV A velocity: 63.20 cm/s MV E/A ratio:  0.87  Kardie Tobb DO Electronically signed by Dub Huntsman DO Signature Date/Time: 06/14/2024/2:48:10 PM       Final (Updated)     Narrative &  Impression  CLINICAL DATA:  Follow-up of aneurysmal disease of the ascending thoracic aorta.   EXAM: CT ANGIOGRAPHY CHEST WITH CONTRAST   TECHNIQUE: Multidetector CT imaging of the chest was performed using the standard protocol during bolus administration of intravenous contrast. Multiplanar CT image reconstructions and MIPs were obtained to evaluate the vascular anatomy.   RADIATION DOSE REDUCTION: This exam was performed according to the departmental dose-optimization program which includes automated exposure control, adjustment of the mA and/or kV according to patient size and/or use of iterative reconstruction technique.   CONTRAST:  75mL OMNIPAQUE  IOHEXOL  350 MG/ML SOLN   COMPARISON:  12/02/2021   FINDINGS: Cardiovascular: Stable thickening and calcification of the aortic valve. Aneurysmal disease of the aortic root at the level of the sinuses of Valsalva estimated to be approximately 4.4-4.6 cm in greatest dimensions by CTA which seems to correlate with prior echocardiography measurements. The true ascending thoracic aorta is only mildly dilated measuring up to 4.1 cm. The proximal arch measures 3.4 cm and the  distal arch 3.0 cm. The descending thoracic aorta measures 2.6 cm. No evidence of aortic dissection or significant aortic atherosclerosis. Visualized proximal great vessels demonstrate normal patency and normal branching anatomy.   Stable mild cardiac enlargement and mild amount of calcified plaque in the distribution of the LAD. No pericardial fluid identified. Central pulmonary arteries are normal in caliber.   Mediastinum/Nodes: No enlarged mediastinal, hilar, or axillary lymph nodes. Thyroid  gland, trachea, and esophagus demonstrate no significant findings.   Lungs/Pleura: There may be some degree of pulmonary venous hypertension without overt interstitial or airspace edema. No consolidation, nodules, pleural effusions or pneumothorax.   Upper Abdomen: Stable 2.5 cm simple cyst in the upper liver.   Musculoskeletal: No chest wall abnormality. No acute or significant osseous findings.   Review of the MIP images confirms the above findings.   IMPRESSION: 1. Stable thickening and calcification of the aortic valve. 2. Aneurysmal disease of the aortic root at the level of the sinuses of Valsalva measuring approximately 4.4-4.6 cm in greatest dimensions by CTA. 3. Mild aneurysmal disease of the ascending thoracic aorta measuring up to approximately 4.1 cm. Recommend annual imaging followup by CTA or MRA. This recommendation follows 2010 ACCF/AHA/AATS/ACR/ASA/SCA/SCAI/SIR/STS/SVM Guidelines for the Diagnosis and Management of Patients with Thoracic Aortic Disease. Circulation. 2010; 121: Z733-z630. Aortic aneurysm NOS (ICD10-I71.9) 4. Stable mild cardiac enlargement and mild amount of calcified plaque in the distribution of the LAD. 5. Stable 2.5 cm simple cyst in the upper liver.     Electronically Signed   By: Marcey Moan M.D.   On: 06/18/2024 08:32      Impression:  This 68 year old gentleman has a functionally bicuspid aortic valve with mild calcification and  thickening and central aortic insufficiency that looks visually moderate to me with a vena contracta of 0.65 suggesting severe insufficiency and a high pressure half-time of 1347 ms which I doubt is accurate.  I have reviewed his previous echocardiograms dating back to 2017 and his AI pressure half-time's have typically been in the 500s since then.  Visually it does not appear that the aortic insufficiency has changed since 2018.  He remains completely asymptomatic with normal left ventricular ejection fraction and LV dimension and I do not think there is any indication for surgical treatment at this time.  His recent echocardiogram measured the aortic root at 44 mm and the ascending aorta at 47 mm.  CTA of his chest showed the aortic root to be between 44 and 46  mm and the ascending aorta to be 41 mm.  I have personally reviewed this study and measured by myself and agree with those measurements.  There is no indication for surgical treatment of his aortic aneurysm since it is below 5 cm and stable.  I reviewed the echo and CT images with the patient and his wife and answered all their questions.  I reviewed the signs and symptoms of symptomatic aortic insufficiency.  I told them that the indications for surgical treatment would be development of symptoms or decreasing left ventricular systolic function or increasing internal dimensions.  I recommended a follow-up echocardiogram every 6 months and ask him to stay observant for development of signs or symptoms of aortic insufficiency.  He may benefit from having a cardiac MR at some point to objectively quantitate the amount of aortic insufficiency if a decision is being made about proceeding with surgery.  I stressed the importance of continued good blood pressure control in preventing further enlargement of his aorta and acute aortic dissection.  Plan:  He will continue to follow-up with Dr. Raford and his PCP, Dr. Charlott, and I will be happy to see him  back if the need arises.   I spent 45 minutes performing this consultation and > 50% of this time was spent face to face counseling and coordinating the care of this patient's bicuspid aortic valve insufficiency and ascending aortic aneurysm.  Dorise MARLA Fellers, MD [facility] [phone]

## 2024-08-13 NOTE — Progress Notes (Signed)
 Remote Loop Recorder Transmission

## 2024-08-14 ENCOUNTER — Encounter

## 2024-08-14 ENCOUNTER — Ambulatory Visit: Payer: Self-pay | Admitting: Cardiovascular Disease

## 2024-08-29 ENCOUNTER — Encounter

## 2024-08-30 ENCOUNTER — Encounter

## 2024-08-31 ENCOUNTER — Ambulatory Visit: Attending: Cardiovascular Disease

## 2024-08-31 DIAGNOSIS — I480 Paroxysmal atrial fibrillation: Secondary | ICD-10-CM | POA: Diagnosis not present

## 2024-09-01 LAB — CUP PACEART REMOTE DEVICE CHECK
Date Time Interrogation Session: 20251023233933
Implantable Pulse Generator Implant Date: 20250310

## 2024-09-04 NOTE — Progress Notes (Signed)
 Remote Loop Recorder Transmission

## 2024-09-05 ENCOUNTER — Other Ambulatory Visit (HOSPITAL_BASED_OUTPATIENT_CLINIC_OR_DEPARTMENT_OTHER): Payer: Self-pay | Admitting: Cardiovascular Disease

## 2024-09-12 ENCOUNTER — Ambulatory Visit: Payer: Self-pay | Admitting: Cardiovascular Disease

## 2024-09-18 ENCOUNTER — Encounter

## 2024-09-29 ENCOUNTER — Encounter

## 2024-10-01 ENCOUNTER — Ambulatory Visit

## 2024-10-01 ENCOUNTER — Encounter

## 2024-10-01 DIAGNOSIS — I480 Paroxysmal atrial fibrillation: Secondary | ICD-10-CM | POA: Diagnosis not present

## 2024-10-02 LAB — CUP PACEART REMOTE DEVICE CHECK
Date Time Interrogation Session: 20251123235913
Implantable Pulse Generator Implant Date: 20250310

## 2024-10-02 NOTE — Progress Notes (Signed)
 Remote Loop Recorder Transmission

## 2024-10-09 ENCOUNTER — Ambulatory Visit: Payer: Self-pay | Admitting: Cardiovascular Disease

## 2024-10-23 ENCOUNTER — Encounter

## 2024-11-01 ENCOUNTER — Encounter

## 2024-11-01 ENCOUNTER — Ambulatory Visit

## 2024-11-01 DIAGNOSIS — I480 Paroxysmal atrial fibrillation: Secondary | ICD-10-CM

## 2024-11-02 LAB — CUP PACEART REMOTE DEVICE CHECK
Date Time Interrogation Session: 20251224233619
Implantable Pulse Generator Implant Date: 20250310

## 2024-11-02 NOTE — Progress Notes (Signed)
 Remote Loop Recorder Transmission

## 2024-11-07 ENCOUNTER — Ambulatory Visit: Payer: Self-pay | Admitting: Cardiovascular Disease

## 2024-12-02 ENCOUNTER — Ambulatory Visit: Attending: Cardiovascular Disease

## 2024-12-02 DIAGNOSIS — I480 Paroxysmal atrial fibrillation: Secondary | ICD-10-CM | POA: Diagnosis not present

## 2024-12-03 LAB — CUP PACEART REMOTE DEVICE CHECK
Date Time Interrogation Session: 20260124235009
Implantable Pulse Generator Implant Date: 20250310

## 2024-12-04 ENCOUNTER — Ambulatory Visit: Payer: Self-pay | Admitting: Cardiovascular Disease

## 2024-12-06 NOTE — Progress Notes (Signed)
 Remote Loop Recorder Transmission

## 2024-12-28 ENCOUNTER — Other Ambulatory Visit (HOSPITAL_BASED_OUTPATIENT_CLINIC_OR_DEPARTMENT_OTHER)

## 2025-01-02 ENCOUNTER — Ambulatory Visit

## 2025-01-29 ENCOUNTER — Ambulatory Visit (HOSPITAL_BASED_OUTPATIENT_CLINIC_OR_DEPARTMENT_OTHER): Admitting: Family

## 2025-02-02 ENCOUNTER — Ambulatory Visit
